# Patient Record
Sex: Male | Born: 1960 | Race: White | Hispanic: No | Marital: Married
Health system: Southern US, Community
[De-identification: ages and names within clinical notes are randomized; demographics above are authoritative.]

## PROBLEM LIST (undated history)

## (undated) DIAGNOSIS — M1990 Unspecified osteoarthritis, unspecified site: Secondary | ICD-10-CM

## (undated) DIAGNOSIS — E785 Hyperlipidemia, unspecified: Secondary | ICD-10-CM

## (undated) HISTORY — PX: HERNIA REPAIR: SHX51

## (undated) HISTORY — PX: BACK SURGERY: SHX140

## (undated) HISTORY — PX: SHOULDER SURGERY: SHX246

## (undated) HISTORY — PX: REPLACEMENT TOTAL KNEE: SUR1224

---

## 2018-01-05 ENCOUNTER — Encounter (HOSPITAL_COMMUNITY)
Admission: EM | Disposition: A | Discharge status: Home / Self Care | Payer: Self-pay | Source: Home / Self Care | Attending: Emergency Medicine

## 2018-01-05 ENCOUNTER — Emergency Department (HOSPITAL_COMMUNITY): Payer: BLUE CROSS/BLUE SHIELD

## 2018-01-05 ENCOUNTER — Encounter (HOSPITAL_COMMUNITY): Payer: Self-pay | Admitting: Emergency Medicine

## 2018-01-05 ENCOUNTER — Emergency Department (HOSPITAL_COMMUNITY): Payer: BLUE CROSS/BLUE SHIELD | Admitting: Registered Nurse

## 2018-01-05 ENCOUNTER — Observation Stay (HOSPITAL_COMMUNITY)
Admission: EM | Admit: 2018-01-05 | Discharge: 2018-01-06 | Disposition: A | Discharge status: Home / Self Care | Payer: BLUE CROSS/BLUE SHIELD | Attending: General Surgery | Admitting: General Surgery

## 2018-01-05 DIAGNOSIS — E785 Hyperlipidemia, unspecified: Secondary | ICD-10-CM | POA: Insufficient documentation

## 2018-01-05 DIAGNOSIS — M109 Gout, unspecified: Secondary | ICD-10-CM | POA: Insufficient documentation

## 2018-01-05 DIAGNOSIS — I700 Atherosclerosis of aorta: Secondary | ICD-10-CM | POA: Insufficient documentation

## 2018-01-05 DIAGNOSIS — K3580 Unspecified acute appendicitis: Principal | ICD-10-CM | POA: Insufficient documentation

## 2018-01-05 DIAGNOSIS — K3589 Other acute appendicitis without perforation or gangrene: Secondary | ICD-10-CM | POA: Diagnosis present

## 2018-01-05 DIAGNOSIS — Z7982 Long term (current) use of aspirin: Secondary | ICD-10-CM | POA: Diagnosis not present

## 2018-01-05 DIAGNOSIS — Z888 Allergy status to other drugs, medicaments and biological substances status: Secondary | ICD-10-CM | POA: Diagnosis not present

## 2018-01-05 DIAGNOSIS — Z791 Long term (current) use of non-steroidal anti-inflammatories (NSAID): Secondary | ICD-10-CM | POA: Diagnosis not present

## 2018-01-05 DIAGNOSIS — K55069 Acute infarction of intestine, part and extent unspecified: Secondary | ICD-10-CM

## 2018-01-05 DIAGNOSIS — Z79899 Other long term (current) drug therapy: Secondary | ICD-10-CM | POA: Insufficient documentation

## 2018-01-05 HISTORY — DX: Hyperlipidemia, unspecified: E78.5

## 2018-01-05 HISTORY — PX: LAPAROSCOPIC APPENDECTOMY: SHX408

## 2018-01-05 HISTORY — DX: Unspecified osteoarthritis, unspecified site: M19.90

## 2018-01-05 LAB — URINALYSIS, ROUTINE W REFLEX MICROSCOPIC
BILIRUBIN URINE: NEGATIVE
Glucose, UA: NEGATIVE mg/dL
HGB URINE DIPSTICK: NEGATIVE
KETONES UR: NEGATIVE mg/dL
Leukocytes, UA: NEGATIVE
NITRITE: NEGATIVE
Protein, ur: NEGATIVE mg/dL
Specific Gravity, Urine: 1.006 (ref 1.005–1.030)
pH: 6 (ref 5.0–8.0)

## 2018-01-05 LAB — COMPREHENSIVE METABOLIC PANEL
ALK PHOS: 82 U/L (ref 38–126)
ALT: 30 U/L (ref 0–44)
ANION GAP: 8 (ref 5–15)
AST: 20 U/L (ref 15–41)
Albumin: 4 g/dL (ref 3.5–5.0)
BILIRUBIN TOTAL: 0.9 mg/dL (ref 0.3–1.2)
BUN: 12 mg/dL (ref 6–20)
CALCIUM: 9.4 mg/dL (ref 8.9–10.3)
CO2: 24 mmol/L (ref 22–32)
Chloride: 104 mmol/L (ref 98–111)
Creatinine, Ser: 1.07 mg/dL (ref 0.61–1.24)
GFR calc Af Amer: >60 mL/min (ref >60)
GFR calc non Af Amer: >60 mL/min (ref >60)
Glucose, Bld: 127 mg/dL — ABNORMAL HIGH (ref 70–99)
Potassium: 4.3 mmol/L (ref 3.5–5.1)
SODIUM: 136 mmol/L (ref 135–145)
TOTAL PROTEIN: 7.7 g/dL (ref 6.5–8.1)

## 2018-01-05 LAB — CBC WITH DIFFERENTIAL/PLATELET
Abs Immature Granulocytes: 0.02 10*3/uL (ref 0.00–0.07)
BASOS ABS: 0 10*3/uL (ref 0.0–0.1)
BASOS PCT: 0 %
EOS ABS: 0.1 10*3/uL (ref 0.0–0.5)
EOS PCT: 2 %
HCT: 50.3 % (ref 39.0–52.0)
Hemoglobin: 16.4 g/dL (ref 13.0–17.0)
Immature Granulocytes: 0 %
Lymphocytes Relative: 26 %
Lymphs Abs: 2 10*3/uL (ref 0.7–4.0)
MCH: 28.7 pg (ref 26.0–34.0)
MCHC: 32.6 g/dL (ref 30.0–36.0)
MCV: 87.9 fL (ref 80.0–100.0)
MONO ABS: 0.7 10*3/uL (ref 0.1–1.0)
Monocytes Relative: 9 %
NRBC: 0 % (ref 0.0–0.2)
Neutro Abs: 4.7 10*3/uL (ref 1.7–7.7)
Neutrophils Relative %: 63 %
Platelets: 241 10*3/uL (ref 150–400)
RBC: 5.72 MIL/uL (ref 4.22–5.81)
RDW: 12.6 % (ref 11.5–15.5)
WBC: 7.6 10*3/uL (ref 4.0–10.5)

## 2018-01-05 LAB — LIPASE, BLOOD: LIPASE: 35 U/L (ref 11–51)

## 2018-01-05 SURGERY — APPENDECTOMY, LAPAROSCOPIC
Anesthesia: General | Site: Abdomen

## 2018-01-05 MED ORDER — METHOCARBAMOL 500 MG PO TABS
500.0000 mg | ORAL_TABLET | Freq: Four times a day (QID) | ORAL | Status: DC | PRN
  Administered 2018-01-05: 500 mg via ORAL

## 2018-01-05 MED ORDER — PIPERACILLIN-TAZOBACTAM 3.375 G IVPB 30 MIN
3.3750 g | Freq: Once | INTRAVENOUS | Status: AC
  Administered 2018-01-05: 3.375 g via INTRAVENOUS
  Filled 2018-01-05 (×2): qty 50

## 2018-01-05 MED ORDER — PIPERACILLIN-TAZOBACTAM 3.375 G IVPB 30 MIN: 3.3750 g | Freq: Once | INTRAVENOUS | Status: DC

## 2018-01-05 MED ORDER — METHOCARBAMOL 500 MG PO TABS
ORAL_TABLET | ORAL | Status: AC
  Filled 2018-01-05: qty 1

## 2018-01-05 MED ORDER — HYDROMORPHONE HCL 1 MG/ML IJ SOLN
1.0000 mg | Freq: Once | INTRAMUSCULAR | Status: AC
  Administered 2018-01-05: 1 mg via INTRAVENOUS
  Filled 2018-01-05: qty 1

## 2018-01-05 MED ORDER — LACTATED RINGERS IV SOLN
INTRAVENOUS | Status: DC
  Administered 2018-01-05 (×2): via INTRAVENOUS

## 2018-01-05 MED ORDER — ONDANSETRON 4 MG PO TBDP: 4.0000 mg | ORAL_TABLET | Freq: Four times a day (QID) | ORAL | Status: DC | PRN

## 2018-01-05 MED ORDER — BUPIVACAINE-EPINEPHRINE 0.25% -1:200000 IJ SOLN
INTRAMUSCULAR | Status: DC | PRN
  Administered 2018-01-05: 30 mL

## 2018-01-05 MED ORDER — ONDANSETRON HCL 4 MG/2ML IJ SOLN: 4.0000 mg | Freq: Four times a day (QID) | INTRAMUSCULAR | Status: DC | PRN

## 2018-01-05 MED ORDER — SUCCINYLCHOLINE CHLORIDE 20 MG/ML IJ SOLN
INTRAMUSCULAR | Status: DC | PRN
  Administered 2018-01-05: 120 mg via INTRAVENOUS

## 2018-01-05 MED ORDER — MEPERIDINE HCL 50 MG/ML IJ SOLN: 6.2500 mg | INTRAMUSCULAR | Status: DC | PRN

## 2018-01-05 MED ORDER — SODIUM CHLORIDE 0.9 % IR SOLN
Status: DC | PRN
  Administered 2018-01-05: 1000 mL

## 2018-01-05 MED ORDER — MIDAZOLAM HCL 2 MG/2ML IJ SOLN
INTRAMUSCULAR | Status: AC
  Filled 2018-01-05: qty 2

## 2018-01-05 MED ORDER — BUPIVACAINE-EPINEPHRINE (PF) 0.25% -1:200000 IJ SOLN
INTRAMUSCULAR | Status: AC
  Filled 2018-01-05: qty 30

## 2018-01-05 MED ORDER — CELECOXIB 200 MG PO CAPS
200.0000 mg | ORAL_CAPSULE | Freq: Two times a day (BID) | ORAL | Status: DC
  Administered 2018-01-05: 200 mg via ORAL
  Filled 2018-01-05: qty 1

## 2018-01-05 MED ORDER — ONDANSETRON HCL 4 MG/2ML IJ SOLN
INTRAMUSCULAR | Status: AC
  Filled 2018-01-05: qty 2

## 2018-01-05 MED ORDER — ONDANSETRON HCL 4 MG/2ML IJ SOLN
INTRAMUSCULAR | Status: AC
  Administered 2018-01-05: 4 mg via INTRAVENOUS
  Filled 2018-01-05: qty 2

## 2018-01-05 MED ORDER — LIDOCAINE 2% (20 MG/ML) 5 ML SYRINGE
INTRAMUSCULAR | Status: AC
  Filled 2018-01-05: qty 5

## 2018-01-05 MED ORDER — SODIUM CHLORIDE 0.9 % IV BOLUS
1000.0000 mL | Freq: Once | INTRAVENOUS | Status: AC
  Administered 2018-01-05: 1000 mL via INTRAVENOUS

## 2018-01-05 MED ORDER — GABAPENTIN 300 MG PO CAPS
300.0000 mg | ORAL_CAPSULE | Freq: Two times a day (BID) | ORAL | Status: DC
  Administered 2018-01-05: 300 mg via ORAL
  Filled 2018-01-05: qty 1

## 2018-01-05 MED ORDER — SODIUM CHLORIDE 0.9 % IV SOLN
INTRAVENOUS | Status: DC
  Administered 2018-01-05: 10:00:00 via INTRAVENOUS

## 2018-01-05 MED ORDER — HYDROMORPHONE HCL 1 MG/ML IJ SOLN
0.2500 mg | INTRAMUSCULAR | Status: DC | PRN
  Administered 2018-01-05 (×3): 0.5 mg via INTRAVENOUS

## 2018-01-05 MED ORDER — FENTANYL CITRATE (PF) 100 MCG/2ML IJ SOLN
50.0000 ug | Freq: Once | INTRAMUSCULAR | Status: AC
  Administered 2018-01-05: 50 ug via INTRAVENOUS

## 2018-01-05 MED ORDER — OXYCODONE HCL 5 MG PO TABS
5.0000 mg | ORAL_TABLET | ORAL | Status: DC | PRN
  Administered 2018-01-05: 5 mg via ORAL

## 2018-01-05 MED ORDER — HYDRALAZINE HCL 20 MG/ML IJ SOLN: 10.0000 mg | INTRAMUSCULAR | Status: DC | PRN

## 2018-01-05 MED ORDER — IOHEXOL 300 MG/ML  SOLN
100.0000 mL | Freq: Once | INTRAMUSCULAR | Status: AC | PRN
  Administered 2018-01-05: 100 mL via INTRAVENOUS

## 2018-01-05 MED ORDER — FENTANYL CITRATE (PF) 250 MCG/5ML IJ SOLN
INTRAMUSCULAR | Status: AC
  Filled 2018-01-05: qty 5

## 2018-01-05 MED ORDER — FENTANYL CITRATE (PF) 100 MCG/2ML IJ SOLN
INTRAMUSCULAR | Status: DC | PRN
  Administered 2018-01-05 (×2): 50 ug via INTRAVENOUS

## 2018-01-05 MED ORDER — DIPHENHYDRAMINE HCL 25 MG PO CAPS: 25.0000 mg | ORAL_CAPSULE | Freq: Four times a day (QID) | ORAL | Status: DC | PRN

## 2018-01-05 MED ORDER — HYDROMORPHONE HCL 1 MG/ML IJ SOLN
INTRAMUSCULAR | Status: AC
  Filled 2018-01-05: qty 1

## 2018-01-05 MED ORDER — HYDROMORPHONE HCL 1 MG/ML IJ SOLN
INTRAMUSCULAR | Status: AC
  Administered 2018-01-05: 0.5 mg via INTRAVENOUS
  Filled 2018-01-05: qty 1

## 2018-01-05 MED ORDER — DEXTROSE-NACL 5-0.45 % IV SOLN
INTRAVENOUS | Status: DC
  Administered 2018-01-05: 23:00:00 via INTRAVENOUS

## 2018-01-05 MED ORDER — ONDANSETRON HCL 4 MG/2ML IJ SOLN
4.0000 mg | Freq: Once | INTRAMUSCULAR | Status: AC
  Administered 2018-01-05: 4 mg via INTRAVENOUS
  Filled 2018-01-05: qty 2

## 2018-01-05 MED ORDER — HYDROMORPHONE HCL 1 MG/ML IJ SOLN
0.5000 mg | Freq: Once | INTRAMUSCULAR | Status: AC
  Administered 2018-01-05: 0.5 mg via INTRAVENOUS
  Filled 2018-01-05: qty 1

## 2018-01-05 MED ORDER — HYDROMORPHONE HCL 1 MG/ML IJ SOLN: 0.5000 mg | INTRAMUSCULAR | Status: DC | PRN

## 2018-01-05 MED ORDER — ONDANSETRON HCL 4 MG/2ML IJ SOLN
INTRAMUSCULAR | Status: DC | PRN
  Administered 2018-01-05: 4 mg via INTRAVENOUS

## 2018-01-05 MED ORDER — MIDAZOLAM HCL 5 MG/5ML IJ SOLN
INTRAMUSCULAR | Status: DC | PRN
  Administered 2018-01-05: 2 mg via INTRAVENOUS

## 2018-01-05 MED ORDER — HYDROCODONE-ACETAMINOPHEN 5-325 MG PO TABS: 1.0000 | ORAL_TABLET | ORAL | Status: DC | PRN

## 2018-01-05 MED ORDER — ROCURONIUM BROMIDE 50 MG/5ML IV SOSY
PREFILLED_SYRINGE | INTRAVENOUS | Status: AC
  Filled 2018-01-05: qty 5

## 2018-01-05 MED ORDER — 0.9 % SODIUM CHLORIDE (POUR BTL) OPTIME
TOPICAL | Status: DC | PRN
  Administered 2018-01-05: 1000 mL

## 2018-01-05 MED ORDER — ROCURONIUM BROMIDE 50 MG/5ML IV SOSY
PREFILLED_SYRINGE | INTRAVENOUS | Status: DC | PRN
  Administered 2018-01-05: 30 mg via INTRAVENOUS
  Administered 2018-01-05: 10 mg via INTRAVENOUS

## 2018-01-05 MED ORDER — TRAMADOL HCL 50 MG PO TABS: 50.0000 mg | ORAL_TABLET | Freq: Four times a day (QID) | ORAL | Status: DC | PRN

## 2018-01-05 MED ORDER — SUGAMMADEX SODIUM 200 MG/2ML IV SOLN
INTRAVENOUS | Status: DC | PRN
  Administered 2018-01-05: 150 mg via INTRAVENOUS
  Administered 2018-01-05: 50 mg via INTRAVENOUS

## 2018-01-05 MED ORDER — DEXAMETHASONE SODIUM PHOSPHATE 10 MG/ML IJ SOLN
INTRAMUSCULAR | Status: DC | PRN
  Administered 2018-01-05: 10 mg via INTRAVENOUS

## 2018-01-05 MED ORDER — GLYCOPYRROLATE PF 0.2 MG/ML IJ SOSY
PREFILLED_SYRINGE | INTRAMUSCULAR | Status: AC
  Filled 2018-01-05: qty 2

## 2018-01-05 MED ORDER — SODIUM CHLORIDE 0.45 % IV SOLN: INTRAVENOUS | Status: DC

## 2018-01-05 MED ORDER — PROPOFOL 10 MG/ML IV BOLUS
INTRAVENOUS | Status: DC | PRN
  Administered 2018-01-05: 50 mg via INTRAVENOUS
  Administered 2018-01-05: 150 mg via INTRAVENOUS

## 2018-01-05 MED ORDER — ONDANSETRON HCL 4 MG/2ML IJ SOLN
4.0000 mg | Freq: Four times a day (QID) | INTRAMUSCULAR | Status: DC | PRN
  Administered 2018-01-05: 4 mg via INTRAVENOUS

## 2018-01-05 MED ORDER — OXYCODONE HCL 5 MG PO TABS
ORAL_TABLET | ORAL | Status: AC
  Filled 2018-01-05: qty 1

## 2018-01-05 MED ORDER — FENTANYL CITRATE (PF) 100 MCG/2ML IJ SOLN
INTRAMUSCULAR | Status: AC
  Administered 2018-01-05: 50 ug via INTRAVENOUS
  Filled 2018-01-05: qty 2

## 2018-01-05 MED ORDER — DIPHENHYDRAMINE HCL 50 MG/ML IJ SOLN: 25.0000 mg | Freq: Four times a day (QID) | INTRAMUSCULAR | Status: DC | PRN

## 2018-01-05 MED ORDER — MORPHINE SULFATE (PF) 2 MG/ML IV SOLN: 2.0000 mg | INTRAVENOUS | Status: DC | PRN

## 2018-01-05 MED ORDER — EPHEDRINE 5 MG/ML INJ
INTRAVENOUS | Status: AC
  Filled 2018-01-05: qty 20

## 2018-01-05 MED ORDER — PROPOFOL 10 MG/ML IV BOLUS
INTRAVENOUS | Status: AC
  Filled 2018-01-05: qty 20

## 2018-01-05 MED ORDER — PHENYLEPHRINE 40 MCG/ML (10ML) SYRINGE FOR IV PUSH (FOR BLOOD PRESSURE SUPPORT)
PREFILLED_SYRINGE | INTRAVENOUS | Status: AC
  Filled 2018-01-05: qty 10

## 2018-01-05 MED ORDER — LIDOCAINE 2% (20 MG/ML) 5 ML SYRINGE
INTRAMUSCULAR | Status: DC | PRN
  Administered 2018-01-05: 100 mg via INTRAVENOUS

## 2018-01-05 MED ORDER — PROMETHAZINE HCL 25 MG/ML IJ SOLN: 6.2500 mg | INTRAMUSCULAR | Status: DC | PRN

## 2018-01-05 SURGICAL SUPPLY — 38 items
APPLIER CLIP 5 13 M/L LIGAMAX5 (MISCELLANEOUS)
BLADE CLIPPER SURG (BLADE) ×3 IMPLANT
CANISTER SUCT 3000ML PPV (MISCELLANEOUS) ×3 IMPLANT
CHLORAPREP W/TINT 26ML (MISCELLANEOUS) ×3 IMPLANT
CLIP APPLIE 5 13 M/L LIGAMAX5 (MISCELLANEOUS) IMPLANT
COVER SURGICAL LIGHT HANDLE (MISCELLANEOUS) ×3 IMPLANT
COVER WAND RF STERILE (DRAPES) ×3 IMPLANT
CUTTER ENDO LINEAR 45M (STAPLE) ×3 IMPLANT
DERMABOND ADVANCED (GAUZE/BANDAGES/DRESSINGS) ×2
DERMABOND ADVANCED .7 DNX12 (GAUZE/BANDAGES/DRESSINGS) ×1 IMPLANT
DEVICE PMI PUNCTURE CLOSURE (MISCELLANEOUS) ×3 IMPLANT
ELECT REM PT RETURN 9FT ADLT (ELECTROSURGICAL) ×3
ELECTRODE REM PT RTRN 9FT ADLT (ELECTROSURGICAL) ×1 IMPLANT
GLOVE BIO SURGEON STRL SZ 6 (GLOVE) ×3 IMPLANT
GLOVE INDICATOR 6.5 STRL GRN (GLOVE) ×3 IMPLANT
GOWN STRL REUS W/ TWL LRG LVL3 (GOWN DISPOSABLE) ×3 IMPLANT
GOWN STRL REUS W/TWL LRG LVL3 (GOWN DISPOSABLE) ×6
KIT BASIN OR (CUSTOM PROCEDURE TRAY) ×3 IMPLANT
KIT TURNOVER KIT B (KITS) ×3 IMPLANT
NEEDLE INSUFFLATION 14GA 120MM (NEEDLE) ×3 IMPLANT
NS IRRIG 1000ML POUR BTL (IV SOLUTION) ×3 IMPLANT
PAD ARMBOARD 7.5X6 YLW CONV (MISCELLANEOUS) ×6 IMPLANT
POUCH SPECIMEN RETRIEVAL 10MM (ENDOMECHANICALS) ×3 IMPLANT
RELOAD 45 VASCULAR/THIN (ENDOMECHANICALS) IMPLANT
RELOAD STAPLE TA45 3.5 REG BLU (ENDOMECHANICALS) IMPLANT
SCISSORS ENDO CVD 5DCS (MISCELLANEOUS) IMPLANT
SET IRRIG TUBING LAPAROSCOPIC (IRRIGATION / IRRIGATOR) ×3 IMPLANT
SHEARS HARMONIC ACE PLUS 36CM (ENDOMECHANICALS) IMPLANT
SLEEVE ENDOPATH XCEL 5M (ENDOMECHANICALS) ×3 IMPLANT
SPECIMEN JAR SMALL (MISCELLANEOUS) ×3 IMPLANT
SUT MNCRL AB 4-0 PS2 18 (SUTURE) ×3 IMPLANT
TOWEL OR 17X24 6PK STRL BLUE (TOWEL DISPOSABLE) ×3 IMPLANT
TRAY FOLEY CATH SILVER 16FR (SET/KITS/TRAYS/PACK) ×3 IMPLANT
TRAY LAPAROSCOPIC MC (CUSTOM PROCEDURE TRAY) ×3 IMPLANT
TROCAR XCEL 12X100 BLDLESS (ENDOMECHANICALS) ×3 IMPLANT
TROCAR XCEL NON-BLD 5MMX100MML (ENDOMECHANICALS) ×3 IMPLANT
TUBING INSUFFLATION (TUBING) ×3 IMPLANT
WATER STERILE IRR 1000ML POUR (IV SOLUTION) ×3 IMPLANT

## 2018-01-05 NOTE — ED Triage Notes (Signed)
Pt reports last night acute onset RLQ abdominal pain, pin point. No N/V/D. Hx of hernia repair to LLQ. Still has appendix. Pt in obvious discomfort. No urinary or penile symptoms.

## 2018-01-05 NOTE — Transfer of Care (Signed)
Immediate Anesthesia Transfer of Care Note  Patient: Colin Kim  Procedure(s) Performed: APPENDECTOMY LAPAROSCOPIC (N/A Abdomen)  Patient Location: PACU  Anesthesia Type:General  Level of Consciousness: awake, alert  and oriented  Airway & Oxygen Therapy: Patient Spontanous Breathing and Patient connected to nasal cannula oxygen  Post-op Assessment: Report given to RN and Post -op Vital signs reviewed and stable  Post vital signs: Reviewed and stable  Last Vitals:  Vitals Value Taken Time  BP    Temp    Pulse    Resp 13 01/05/2018  6:43 PM  SpO2    Vitals shown include unvalidated device data.  Last Pain:  Vitals:   01/05/18 1557  PainSc: 4       Patients Stated Pain Goal: 4 (01/05/18 1546)  Complications: No apparent anesthesia complications

## 2018-01-05 NOTE — Progress Notes (Signed)
Gave pt 50mcg of Fentanyl for pain. 50mcg of Fentanyl wasted in sharps, verified and witnessed by Darrick MeigsJamie G, DNP, CRNA.

## 2018-01-05 NOTE — Anesthesia Postprocedure Evaluation (Signed)
Anesthesia Post Note  Patient: Colin Kim  Procedure(s) Performed: APPENDECTOMY LAPAROSCOPIC (N/A Abdomen)     Patient location during evaluation: PACU Anesthesia Type: General Level of consciousness: sedated and patient cooperative Pain management: pain level controlled Vital Signs Assessment: post-procedure vital signs reviewed and stable Respiratory status: spontaneous breathing Cardiovascular status: stable Anesthetic complications: no    Last Vitals:  Vitals:   01/05/18 2124 01/05/18 2126  BP: (!) 149/97 (!) 144/96  Pulse: 72 71  Resp: 16   Temp: 36.7 C   SpO2: 98%     Last Pain:  Vitals:   01/05/18 2124  TempSrc: Oral  PainSc:                  Lewie LoronJohn Kingdavid Leinbach

## 2018-01-05 NOTE — Op Note (Signed)
Preoperative diagnosis: acute appendicitis  Postoperative diagnosis: acute appendicitis, abdominal hemorrhagic cyst  Procedure: laparoscopic appendectomy, laparoscopic excision of cyst  Surgeon: Feliciana RossettiLuke Galya Dunnigan, M.D.  Asst: none  Anesthesia: Gen.   Indications for procedure: Webb LawsDudley Kim is a 57 y.o. male with symptoms of pain in right lower quadrant and nausea consistent with acute appendicitis. Confirmed by CT scan.  Description of procedure: The patient was brought into the operative suite, placed supine. Anesthesia was administered with endotracheal tube. The patient's left arm was tucked. All pressure points were offloaded by foam padding. The patient was prepped and draped in the usual sterile fashion.  A transverse incision was made to the left of the umbilicus and a 5mm trocar was us. Pneumoperitoneum was applied with high flow low pressure.  2 5mm trocars were placed, one in the suprapubic space, one in the LLQ, the periumbilical incision was then up-sized and a 11mm trocar placed in that space. A transversus abdominal block was placed on the left and right sides. Next, the patient was placed in trendelenberg, rotated to the left. The omentum was retracted cephalad. The cecum and appendix were identified. There was a 1-2cm reddish brown mass along the right lower paracolic gutter with some inflamed pericolic fat in the area. Decision was made to excise the mass due to symptoms and because the appendix did not look very inflamed. The mass was free from surroundings and was removed easily. The inflamed portion was cut with scissors and removed. One clip was put at the base of the pericolic fat excision portion. The base of the appendix was dissected and a window through the mesoappendix was created with blunt dissection. Large Hem-o-lock clips were used to doubly ligate the base of the appendix and mesoappendix. The appendix was cut free with scissors.  The appendix was placed in a specimen  bag. The pelvis and RLQ were irrigated. No purulent fluid was identified in the pelvis or abdomen. The appendix was removed via the umbilicus. 0 vicryl was used to close the fascial defect. Pneumoperitoneum was removed, all trocars were removed. All incisions were closed with 4-0 monocryl subcuticular stitch. The patient woke from anesthesia and was brought to PACU in stable condition.  Findings: mildly inflamed appendix, 1-2cm brown mass in the right paracolic gutter  Specimen: appendix  Blood loss: 30 ml  Local anesthesia: 30 ml marcaine  Complications: none   Feliciana RossettiLuke Maeson Purohit, M.D. General, Bariatric, & Minimally Invasive Surgery [facility]

## 2018-01-05 NOTE — Anesthesia Preprocedure Evaluation (Signed)
Anesthesia Evaluation  Patient identified by MRN, date of birth, ID band Patient awake    Reviewed: Allergy & Precautions, NPO status , Patient's Chart, lab work & pertinent test results  Airway Mallampati: I  TM Distance: >3 FB Neck ROM: Full    Dental   Pulmonary    Pulmonary exam normal        Cardiovascular Normal cardiovascular exam     Neuro/Psych    GI/Hepatic   Endo/Other    Renal/GU      Musculoskeletal   Abdominal   Peds  Hematology   Anesthesia Other Findings   Reproductive/Obstetrics                             Anesthesia Physical Anesthesia Plan  ASA: II  Anesthesia Plan: General   Post-op Pain Management:    Induction: Intravenous, Rapid sequence and Cricoid pressure planned  PONV Risk Score and Plan: 2  Airway Management Planned: Oral ETT  Additional Equipment:   Intra-op Plan:   Post-operative Plan: Extubation in OR  Informed Consent: I have reviewed the patients History and Physical, chart, labs and discussed the procedure including the risks, benefits and alternatives for the proposed anesthesia with the patient or authorized representative who has indicated his/her understanding and acceptance.     Plan Discussed with: CRNA and Surgeon  Anesthesia Plan Comments:         Anesthesia Quick Evaluation

## 2018-01-05 NOTE — H&P (Signed)
[facility] Admission Note  Colin Kim Jul 27, 1960  [id number].    Requesting MD: Charlesetta Shanks Chief Complaint/Reason for Consult: acute appendicitis  HPI:  Colin Kim is a 57yo male who presented to [facility] earlier today with worsening abdominal pain. States that yesterday afternoon he started having mild RLQ abdominal pain, and generally felt like he was coming down with something. States that this morning his pain became more severe while at work. It is still in the RLQ, sharp and constant. Appetite suppressed. Denies nausea, vomiting, fever, chills, dysuria.  ED workup included CT scan which shows inflammatory-like thickening at the base of the cecum contiguous with the tip of the appendix, highly suspicious for acute tip appendicitis. WBC 7.6, afebrile. He was started on IV zosyn in the ED, and general surgery was called to see.  He has only had coffee this morning, otherwise NPO today.  -PMH significant for gout -Abdominal surgical history: inguinal hernia repair age 79 -Colonoscopy ~2012 in [facility], 1 benign polyp per patient otherwise normal -Blood thinning medications: 76m ASA daily -Nonsmoker -Employment: works for [employer] ROS: Review of Systems  Constitutional: Negative.  Negative for chills and fever.  HENT: Negative.   Eyes: Negative.   Respiratory: Negative.   Cardiovascular: Negative.   Gastrointestinal: Positive for abdominal pain. Negative for constipation, diarrhea, nausea and vomiting.  Genitourinary: Negative.  Negative for dysuria.  Musculoskeletal: Negative.   Skin: Negative.   Neurological: Negative.    All systems reviewed and otherwise negative except for as above  No family history on file.  Past Medical History:  Diagnosis Date  . Arthritis   . Hyperlipemia     Past Surgical History:  Procedure Laterality Date  . BACK SURGERY    . HERNIA REPAIR    . REPLACEMENT TOTAL KNEE    . SHOULDER SURGERY      Social  History:  has no tobacco, alcohol, and drug history on file.  Allergies:  Allergies  Allergen Reactions  . Statins     Joint pain     (Not in a hospital admission)  Prior to Admission medications   Medication Sig Start Date End Date Taking? Authorizing Provider  aspirin EC 81 MG tablet Take 81 mg by mouth at bedtime.   Yes [provider]  ibuprofen (ADVIL,MOTRIN) 800 MG tablet Take 800 mg by mouth 3 (three) times daily. 11/23/17  Yes [provider]    Blood pressure (!) 132/92, pulse 61, temperature 98.4 F (36.9 C), resp. rate 17, SpO2 96 %. Physical Exam: General: pleasant, WD/WN white male who is laying in bed in NAD HEENT: head is normocephalic, atraumatic.  Sclera are noninjected.  Pupils equal and round.  Ears and nose without any masses or lesions.  Mouth is pink and moist. Dentition fair Heart: regular, rate, and rhythm.  No obvious murmurs, gallops, or rubs noted.  Palpable pedal pulses bilaterally Lungs: CTAB, no wheezes, rhonchi, or rales noted.  Respiratory effort nonlabored Abd: soft, ND, +BS, no masses, hernias, or organomegaly. TTP RLQ with voluntary guarding MS: all 4 extremities are symmetrical with no cyanosis, clubbing, or edema. Skin: warm and dry with no masses, lesions, or rashes Psych: A&Ox3 with an appropriate affect. Neuro: cranial nerves grossly intact, extremity CSM intact bilaterally, normal speech  Results for orders placed or performed during the hospital encounter of 01/05/18 (from the past 48 hour(s))  Comprehensive metabolic panel     Status: Abnormal   Collection Time: 01/05/18  9:06 AM  Result Value Ref Range   Sodium 136 135 - 145 mmol/L   Potassium 4.3 3.5 - 5.1 mmol/L   Chloride 104 98 - 111 mmol/L   CO2 24 22 - 32 mmol/L   Glucose, Bld 127 (H) 70 - 99 mg/dL   BUN 12 6 - 20 mg/dL   Creatinine, Ser 1.07 0.61 - 1.24 mg/dL   Calcium 9.4 8.9 - 10.3 mg/dL   Total Protein 7.7 6.5 - 8.1 g/dL   Albumin 4.0 3.5 - 5.0 g/dL    AST 20 15 - 41 U/L   ALT 30 0 - 44 U/L   Alkaline Phosphatase 82 38 - 126 U/L   Total Bilirubin 0.9 0.3 - 1.2 mg/dL   GFR calc non Af Amer >60 >60 mL/min   GFR calc Af Amer >60 >60 mL/min    Comment: (NOTE) The eGFR has been calculated using the CKD EPI equation. This calculation has not been validated in all clinical situations. eGFR's persistently <60 mL/min signify possible Chronic Kidney Disease.    Anion gap 8 5 - 15    Comment: Performed at [facility] [address] [place], [place] [address]  Lipase, blood     Status: None   Collection Time: 01/05/18  9:06 AM  Result Value Ref Range   Lipase 35 11 - 51 U/L    Comment: Performed at [facility] [address], [place], [place] [address]  CBC WITH DIFFERENTIAL     Status: None   Collection Time: 01/05/18  9:06 AM  Result Value Ref Range   WBC 7.6 4.0 - 10.5 K/uL   RBC 5.72 4.22 - 5.81 MIL/uL   Hemoglobin 16.4 13.0 - 17.0 g/dL   HCT 50.3 39.0 - 52.0 %   MCV 87.9 80.0 - 100.0 fL   MCH 28.7 26.0 - 34.0 pg   MCHC 32.6 30.0 - 36.0 g/dL   RDW 12.6 11.5 - 15.5 %   Platelets 241 150 - 400 K/uL   nRBC 0.0 0.0 - 0.2 %   Neutrophils Relative % 63 %   Neutro Abs 4.7 1.7 - 7.7 K/uL   Lymphocytes Relative 26 %   Lymphs Abs 2.0 0.7 - 4.0 K/uL   Monocytes Relative 9 %   Monocytes Absolute 0.7 0.1 - 1.0 K/uL   Eosinophils Relative 2 %   Eosinophils Absolute 0.1 0.0 - 0.5 K/uL   Basophils Relative 0 %   Basophils Absolute 0.0 0.0 - 0.1 K/uL   Immature Granulocytes 0 %   Abs Immature Granulocytes 0.02 0.00 - 0.07 K/uL    Comment: Performed at [facility], [address] [place], [place] [address]  Urinalysis, Routine w reflex microscopic     Status: None   Collection Time: 01/05/18 10:10 AM  Result Value Ref Range   Color, Urine YELLOW YELLOW   APPearance CLEAR CLEAR   Specific Gravity, Urine 1.006 1.005 - 1.030   pH 6.0 5.0 - 8.0   Glucose, UA NEGATIVE NEGATIVE mg/dL   Hgb urine dipstick NEGATIVE  NEGATIVE   Bilirubin Urine NEGATIVE NEGATIVE   Ketones, ur NEGATIVE NEGATIVE mg/dL   Protein, ur NEGATIVE NEGATIVE mg/dL   Nitrite NEGATIVE NEGATIVE   Leukocytes, UA NEGATIVE NEGATIVE    Comment: Performed at [facility] [address] [place], [place] [address]   Ct Abdomen Pelvis W Contrast  Result Date: 01/05/2018 CLINICAL DATA:  RIGHT lower quadrant abdominal pain, acute onset last night. Afebrile. EXAM:  CT ABDOMEN AND PELVIS WITH CONTRAST TECHNIQUE: Multidetector CT imaging of the abdomen and pelvis was performed using the standard protocol following bolus administration of intravenous contrast. CONTRAST:  177m OMNIPAQUE IOHEXOL 300 MG/ML  SOLN COMPARISON:  None. FINDINGS: Lower chest: No acute abnormality. Hepatobiliary: No focal liver abnormality is seen. No gallstones, gallbladder wall thickening, or biliary dilatation. Pancreas: Unremarkable. No pancreatic ductal dilatation or surrounding inflammatory changes. Spleen: Normal in size without focal abnormality. Adrenals/Urinary Tract: Adrenal glands appear normal. Kidneys are unremarkable without mass, stone or hydronephrosis. No perinephric fluid. No ureteral or bladder calculi. Bladder appears normal. Stomach/Bowel: No dilated large or small bowel loops. There is prominent inflammatory thickening at the base of the cecum, contiguous with the tip of the appendix (axial series 3, images 70 through 73; sagittal series 7, images 25 through 31; coronal series 6, images 50 through 52; Vascular/Lymphatic: Aortic atherosclerosis. No enlarged abdominal or pelvic lymph nodes. Reproductive: Prostate is unremarkable. Other: Mild fluid stranding within the RIGHT lower quadrant mesentery. No abscess collection seen. No free intraperitoneal air seen. Musculoskeletal: No acute or suspicious osseous finding. Mild degenerative spondylosis within the lower lumbar spine. IMPRESSION: Prominent inflammatory-like thickening at the base of the cecum,  contiguous with the tip of the appendix, highly suspicious for acute tip appendicitis. Additional differential considerations include cecal diverticulitis, typhlitis and neoplastic process of the cecum or appendix. No abscess collection seen. No free intraperitoneal air. Aortic Atherosclerosis (ICD10-I70.0). Electronically Signed   By: SFranki CabotM.D.   On: 01/05/2018 12:03      Assessment/Plan Gout  Acute appendicitis - acute onset, worsening RLQ pain - CT scan shows inflammatory-like thickening at the base of the cecum contiguous with the tip of the appendix, highly suspicious for acute tip appendicitis. WBC 7.6, afebrile - Recommend laparoscopic appendectomy. Keep NPO and continue IVF. He was started on IV zosyn in the ED. Will post for later today.  ID - zosyn 11/15 VTE - SCDs, lovenox FEN - IVF, NPO Foley - none Follow up - TBD  BWellington [facility] 01/05/2018, 12:59 PM Pager: [phone] 7:00 am -11:30 AM Tues-Fri 7:00 am-4:30 pm Sat-Sun 7:00 am-11:30 am

## 2018-01-05 NOTE — ED Provider Notes (Signed)
[facility] EMERGENCY DEPARTMENT Provider Note   CSN: [id number] Arrival date & time: 01/05/18  9629     History   Chief Complaint Chief Complaint  Patient presents with  . Abdominal Pain    RLQ    HPI Colin Kim is a 57 y.o. male.  HPI Patient reports that last night he started getting right lower quadrant pain.  He reports that it started pretty quickly and then just progressively got worse.  There is a local spot in his right lower abdomen that is very painful.  He denies any nausea vomiting or diarrhea.  No fever.  No pain burning with urination.  No blood in the urine.  He denies pain into the testicles.  Patient has had a history of a hernia repair on the left.  He is otherwise healthy with diet-controlled hypercholesterolemia. Past Medical History:  Diagnosis Date  . Arthritis   . Hyperlipemia     There are no active problems to display for this patient.         Home Medications    Prior to Admission medications   Medication Sig Start Date End Date Taking? Authorizing Provider  aspirin EC 81 MG tablet Take 81 mg by mouth at bedtime.   Yes [provider]  ibuprofen (ADVIL,MOTRIN) 800 MG tablet Take 800 mg by mouth 3 (three) times daily. 11/23/17  Yes [provider]    Family History No family history on file.  Social History Social History   Tobacco Use  . Smoking status: Not on file  Substance Use Topics  . Alcohol use: Not on file  . Drug use: Not on file     Allergies   Statins   Review of Systems Review of Systems 10 Systems reviewed and are negative for acute change except as noted in the HPI.  Physical Exam Updated Vital Signs BP (!) 132/92   Pulse 61   Temp 98.4 F (36.9 C)   Resp 17   SpO2 96%   Physical Exam  Constitutional: He is oriented to person, place, and time. He appears well-developed and well-nourished.  Severe pain with movement of abdomen  HENT:  Head: Normocephalic and  atraumatic.  Mouth/Throat: Oropharynx is clear and moist.  Eyes: EOM are normal.  Cardiovascular: Normal rate, regular rhythm, normal heart sounds and intact distal pulses.  Pulmonary/Chest: Effort normal and breath sounds normal.  Abdominal: Soft. He exhibits no distension. There is tenderness. There is guarding.  Severe pain to palpation deep RLQ. No rebound.  Genitourinary:  Genitourinary Comments: Penis and scrotum normal. Testicles and spermatic cord nontender. Can palpate inguinal ring. No mass but very tender in inguinal canal.  Musculoskeletal: Normal range of motion. He exhibits no edema or tenderness.  Neurological: He is alert and oriented to person, place, and time. No cranial nerve deficit. He exhibits normal muscle tone. Coordination normal.  Skin: Skin is warm and dry.  Psychiatric: He has a normal mood and affect.     ED Treatments / Results  Labs (all labs ordered are listed, but only abnormal results are displayed) Labs Reviewed  COMPREHENSIVE METABOLIC PANEL - Abnormal; Notable for the following components:      Result Value   Glucose, Bld 127 (*)    All other components within normal limits  LIPASE, BLOOD  CBC WITH DIFFERENTIAL/PLATELET  URINALYSIS, ROUTINE W REFLEX MICROSCOPIC    EKG None  Radiology Ct Abdomen Pelvis W Contrast  Result Date: 01/05/2018 CLINICAL DATA:  RIGHT  lower quadrant abdominal pain, acute onset last night. Afebrile. EXAM: CT ABDOMEN AND PELVIS WITH CONTRAST TECHNIQUE: Multidetector CT imaging of the abdomen and pelvis was performed using the standard protocol following bolus administration of intravenous contrast. CONTRAST:  100mL OMNIPAQUE IOHEXOL 300 MG/ML  SOLN COMPARISON:  None. FINDINGS: Lower chest: No acute abnormality. Hepatobiliary: No focal liver abnormality is seen. No gallstones, gallbladder wall thickening, or biliary dilatation. Pancreas: Unremarkable. No pancreatic ductal dilatation or surrounding inflammatory changes.  Spleen: Normal in size without focal abnormality. Adrenals/Urinary Tract: Adrenal glands appear normal. Kidneys are unremarkable without mass, stone or hydronephrosis. No perinephric fluid. No ureteral or bladder calculi. Bladder appears normal. Stomach/Bowel: No dilated large or small bowel loops. There is prominent inflammatory thickening at the base of the cecum, contiguous with the tip of the appendix (axial series 3, images 70 through 73; sagittal series 7, images 25 through 31; coronal series 6, images 50 through 52; Vascular/Lymphatic: Aortic atherosclerosis. No enlarged abdominal or pelvic lymph nodes. Reproductive: Prostate is unremarkable. Other: Mild fluid stranding within the RIGHT lower quadrant mesentery. No abscess collection seen. No free intraperitoneal air seen. Musculoskeletal: No acute or suspicious osseous finding. Mild degenerative spondylosis within the lower lumbar spine. IMPRESSION: Prominent inflammatory-like thickening at the base of the cecum, contiguous with the tip of the appendix, highly suspicious for acute tip appendicitis. Additional differential considerations include cecal diverticulitis, typhlitis and neoplastic process of the cecum or appendix. No abscess collection seen. No free intraperitoneal air. Aortic Atherosclerosis (ICD10-I70.0). Electronically Signed   By: Bary RichardStan  Maynard M.D.   On: 01/05/2018 12:03    Procedures Procedures (including critical care time)  Medications Ordered in ED Medications  sodium chloride 0.9 % bolus 1,000 mL (0 mLs Intravenous Stopped 01/05/18 0950)    And  0.9 %  sodium chloride infusion ( Intravenous Stopped 01/05/18 1215)  HYDROmorphone (DILAUDID) injection 1 mg (has no administration in time range)  piperacillin-tazobactam (ZOSYN) IVPB 3.375 g (has no administration in time range)  HYDROmorphone (DILAUDID) injection 0.5 mg (0.5 mg Intravenous Given 01/05/18 0916)  ondansetron (ZOFRAN) injection 4 mg (4 mg Intravenous Given 01/05/18  0916)  HYDROmorphone (DILAUDID) injection 1 mg (1 mg Intravenous Given 01/05/18 0949)  iohexol (OMNIPAQUE) 300 MG/ML solution 100 mL (100 mLs Intravenous Contrast Given 01/05/18 1142)     Initial Impression / Assessment and Plan / ED Course  I have reviewed the triage vital signs and the nursing notes.  Pertinent labs & imaging results that were available during my care of the patient were reviewed by me and considered in my medical decision making (see chart for details).  Clinical Course as of Jan 05 1222  Fri Jan 05, 2018  1214 Consult to general surgery ordered   [MP]  1222 Consult: Reviewed with Nehemiah SettleBrooke of general surgery.  Patient will be evaluated in the emergency department.   [MP]    Clinical Course User Index [MP] Arby BarrettePfeiffer, Gurjit Loconte, MD   Patient is nontoxic and alert.  He had fairly acute onset of right lower quadrant pain.  He otherwise has very limited medical history.  CT shows inflammation at the base of the cecum contiguous with tip of the appendix.  Appendicitis is consistent with the patient's presentation.  General surgery has been consulted.  Patient is pain controlled with Dilaudid.  Zosyn ordered.  Anticipate surgical consultation in the emergency department for definitive management.  Final Clinical Impressions(s) / ED Diagnoses   Final diagnoses:  Other acute appendicitis    ED Discharge Orders  None       Arby Barrette, MD 01/05/18 1226

## 2018-01-05 NOTE — Anesthesia Procedure Notes (Addendum)
Procedure Name: Intubation Date/Time: 01/05/2018 5:38 PM Performed by: Trinna Post., CRNA Pre-anesthesia Checklist: Patient identified, Emergency Drugs available, Suction available, Patient being monitored and Timeout performed Patient Re-evaluated:Patient Re-evaluated prior to induction Oxygen Delivery Method: Circle system utilized Preoxygenation: Pre-oxygenation with 100% oxygen Induction Type: IV induction, Rapid sequence and Cricoid Pressure applied Ventilation: Mask ventilation without difficulty Laryngoscope Size: Mac and 4 Grade View: Grade III Tube type: Oral Tube size: 7.5 mm Number of attempts: 3 (DVL x1 with MAC4 by CRNA with no view, DVLx2 by MDA with success on second attempt) Airway Equipment and Method: Stylet Placement Confirmation: ETT inserted through vocal cords under direct vision,  positive ETCO2 and breath sounds checked- equal and bilateral Secured at: 22 cm Tube secured with: Tape Dental Injury: Teeth and Oropharynx as per pre-operative assessment  Difficulty Due To: Difficulty was unanticipated and Difficult Airway- due to anterior larynx Comments: Would suggest glidescope on future intubations

## 2018-01-06 ENCOUNTER — Encounter (HOSPITAL_COMMUNITY): Payer: Self-pay | Admitting: General Surgery

## 2018-01-06 DIAGNOSIS — K55069 Acute infarction of intestine, part and extent unspecified: Secondary | ICD-10-CM

## 2018-01-06 LAB — HIV ANTIBODY (ROUTINE TESTING W REFLEX): HIV SCREEN 4TH GENERATION: NONREACTIVE

## 2018-01-06 MED ORDER — TRAMADOL HCL 50 MG PO TABS: 50.0000 mg | ORAL_TABLET | Freq: Four times a day (QID) | ORAL | 0 refills | Status: AC | PRN

## 2018-01-06 MED ORDER — ASPIRIN EC 81 MG PO TBEC: 81.0000 mg | DELAYED_RELEASE_TABLET | Freq: Every day | ORAL | Status: DC

## 2018-01-06 NOTE — Progress Notes (Signed)
Colin Kim to be D/C'd  per MD order. Discussed with the patient and all questions fully answered.  VSS, Skin clean, dry and intact without evidence of skin break down, no evidence of skin tears noted.  IV catheter discontinued intact. Site without signs and symptoms of complications. Dressing and pressure applied.  An After Visit Summary was printed and given to the patient. Patient received prescription.  D/c education completed with patient/family including follow up instructions, medication list, d/c activities limitations if indicated, with other d/c instructions as indicated by MD - patient able to verbalize understanding, all questions fully answered.   Patient instructed to return to ED, call 911, or call MD for any changes in condition.   Patient to be escorted via WC, and D/C home via private auto.

## 2018-01-06 NOTE — Discharge Instructions (Signed)
SURGERY: POST OP INSTRUCTIONS °(Surgery for small bowel obstruction, colon resection, etc) ° ° °###################################################################### ° °EAT °Gradually transition to a high fiber diet with a fiber supplement over the next few days after discharge ° °WALK °Walk an hour a day.  Control your pain to do that.   ° °CONTROL PAIN °Control pain so that you can walk, sleep, tolerate sneezing/coughing, go up/down stairs. ° °HAVE A BOWEL MOVEMENT DAILY °Keep your bowels regular to avoid problems.  OK to try a laxative to override constipation.  OK to use an antidairrheal to slow down diarrhea.  Call if not better after 2 tries ° °CALL IF YOU HAVE PROBLEMS/CONCERNS °Call if you are still struggling despite following these instructions. °Call if you have concerns not answered by these instructions ° °###################################################################### ° ° °DIET °Follow a light diet the first few days at home.  Start with a bland diet such as soups, liquids, starchy foods, low fat foods, etc.  If you feel full, bloated, or constipated, stay on a ful liquid or pureed/blenderized diet for a few days until you feel better and no longer constipated. °Be sure to drink plenty of fluids every day to avoid getting dehydrated (feeling dizzy, not urinating, etc.). °Gradually add a fiber supplement to your diet over the next week.  Gradually get back to a regular solid diet.  Avoid fast food or heavy meals the first week as you are more likely to get nauseated. °It is expected for your digestive tract to need a few months to get back to normal.  It is common for your bowel movements and stools to be irregular.  You will have occasional bloating and cramping that should eventually fade away.  Until you are eating solid food normally, off all pain medications, and back to regular activities; your bowels will not be normal. °Focus on eating a low-fat, high fiber diet the rest of your life  (See Getting to Good Bowel Health, below). ° °CARE of your INCISION or WOUND °It is good for closed incision and even open wounds to be washed every day.  Shower every day.  Short baths are fine.  Wash the incisions and wounds clean with soap & water.    °If you have a closed incision(s), wash the incision with soap & water every day.  You may leave closed incisions open to air if it is dry.   You may cover the incision with clean gauze & replace it after your daily shower for comfort. °If you have skin tapes (Steristrips) or skin glue (Dermabond) on your incision, leave them in place.  They will fall off on their own like a scab.  You may trim any edges that curl up with clean scissors.  If you have staples, set up an appointment for them to be removed in the office in 10 days after surgery.  °If you have a drain, wash around the skin exit site with soap & water and place a new dressing of gauze or band aid around the skin every day.  Keep the drain site clean & dry.    °If you have an open wound with packing, see wound care instructions.  In general, it is encouraged that you remove your dressing and packing, shower with soap & water, and replace your dressing once a day.  Pack the wound with clean gauze moistened with normal (0.9%) saline to keep the wound moist & uninfected.  Pressure on the dressing for 30 minutes will stop most wound   bleeding.  Eventually your body will heal & pull the open wound closed over the next few months.  °Raw open wounds will occasionally bleed or secrete yellow drainage until it heals closed.  Drain sites will drain a little until the drain is removed.  Even closed incisions can have mild bleeding or drainage the first few days until the skin edges scab over & seal.   °If you have an open wound with a wound vac, see wound vac care instructions. ° ° ° ° °ACTIVITIES as tolerated °Start light daily activities --- self-care, walking, climbing stairs-- beginning the day after surgery.   Gradually increase activities as tolerated.  Control your pain to be active.  Stop when you are tired.  Ideally, walk several times a day, eventually an hour a day.   °Most people are back to most day-to-day activities in a few weeks.  It takes 4-8 weeks to get back to unrestricted, intense activity. °If you can walk 30 minutes without difficulty, it is safe to try more intense activity such as jogging, treadmill, bicycling, low-impact aerobics, swimming, etc. °Save the most intensive and strenuous activity for last (Usually 4-8 weeks after surgery) such as sit-ups, heavy lifting, contact sports, etc.  Refrain from any intense heavy lifting or straining until you are off narcotics for pain control.  You will have off days, but things should improve week-by-week. °DO NOT PUSH THROUGH PAIN.  Let pain be your guide: If it hurts to do something, don't do it.  Pain is your body warning you to avoid that activity for another week until the pain goes down. °You may drive when you are no longer taking narcotic prescription pain medication, you can comfortably wear a seatbelt, and you can safely make sudden turns/stops to protect yourself without hesitating due to pain. °You may have sexual intercourse when it is comfortable. If it hurts to do something, stop. ° °MEDICATIONS °Take your usually prescribed home medications unless otherwise directed.   °Blood thinners:  °Usually you can restart any strong blood thinners after the second postoperative day.  It is OK to take aspirin right away.    ° If you are on strong blood thinners (warfarin/Coumadin, Plavix, Xerelto, Eliquis, Pradaxa, etc), discuss with your surgeon, medicine PCP, and/or cardiologist for instructions on when to restart the blood thinner & if blood monitoring is needed (PT/INR blood check, etc).   ° ° °PAIN CONTROL °Pain after surgery or related to activity is often due to strain/injury to muscle, tendon, nerves and/or incisions.  This pain is usually  short-term and will improve in a few months.  °To help speed the process of healing and to get back to regular activity more quickly, DO THE FOLLOWING THINGS TOGETHER: °1. Increase activity gradually.  DO NOT PUSH THROUGH PAIN °2. Use Ice and/or Heat °3. Try Gentle Massage and/or Stretching °4. Take over the counter pain medication °5. Take Narcotic prescription pain medication for more severe pain ° °Good pain control = faster recovery.  It is better to take more medicine to be more active than to stay in bed all day to avoid medications. °1.  Increase activity gradually °Avoid heavy lifting at first, then increase to lifting as tolerated over the next 6 weeks. °Do not “push through” the pain.  Listen to your body and avoid positions and maneuvers than reproduce the pain.  Wait a few days before trying something more intense °Walking an hour a day is encouraged to help your body recover faster   and more safely.  Start slowly and stop when getting sore.  If you can walk 30 minutes without stopping or pain, you can try more intense activity (running, jogging, aerobics, cycling, swimming, treadmill, sex, sports, weightlifting, etc.) °Remember: If it hurts to do it, then don’t do it! °2. Use Ice and/or Heat °You will have swelling and bruising around the incisions.  This will take several weeks to resolve. °Ice packs or heating pads (6-8 times a day, 30-60 minutes at a time) will help sooth soreness & bruising. °Some people prefer to use ice alone, heat alone, or alternate between ice & heat.  Experiment and see what works best for you.  Consider trying ice for the first few days to help decrease swelling and bruising; then, switch to heat to help relax sore spots and speed recovery. °Shower every day.  Short baths are fine.  It feels good!  Keep the incisions and wounds clean with soap & water.   °3. Try Gentle Massage and/or Stretching °Massage at the area of pain many times a day °Stop if you feel pain - do not  overdo it °4. Take over the counter pain medication °This helps the muscle and nerve tissues become less irritable and calm down faster °Choose ONE of the following over-the-counter anti-inflammatory medications: °Acetaminophen 500mg tabs (Tylenol) 1-2 pills with every meal and just before bedtime (avoid if you have liver problems or if you have acetaminophen in you narcotic prescription) °Naproxen 220mg tabs (ex. Aleve, Naprosyn) 1-2 pills twice a day (avoid if you have kidney, stomach, IBD, or bleeding problems) °Ibuprofen 200mg tabs (ex. Advil, Motrin) 3-4 pills with every meal and just before bedtime (avoid if you have kidney, stomach, IBD, or bleeding problems) °Take with food/snack several times a day as directed for at least 2 weeks to help keep pain / soreness down & more manageable. °5. Take Narcotic prescription pain medication for more severe pain °A prescription for strong pain control is often given to you upon discharge (for example: oxycodone/Percocet, hydrocodone/Norco/Vicodin, or tramadol/Ultram) °Take your pain medication as prescribed. °Be mindful that most narcotic prescriptions contain Tylenol (acetaminophen) as well - avoid taking too much Tylenol. °If you are having problems/concerns with the prescription medicine (does not control pain, nausea, vomiting, rash, itching, etc.), please call us [phone] to see if we need to switch you to a different pain medicine that will work better for you and/or control your side effects better. °If you need a refill on your pain medication, you must call the office before 4 pm and on weekdays only.  By federal law, prescriptions for narcotics cannot be called into a pharmacy.  They must be filled out on paper & picked up from our office by the patient or authorized caretaker.  Prescriptions cannot be filled after 4 pm nor on weekends.   ° °WHEN TO CALL US [phone] °Severe uncontrolled or worsening pain  °Fever over 101 F (38.5 C) °Concerns with  the incision: Worsening pain, redness, rash/hives, swelling, bleeding, or drainage °Reactions / problems with new medications (itching, rash, hives, nausea, etc.) °Nausea and/or vomiting °Difficulty urinating °Difficulty breathing °Worsening fatigue, dizziness, lightheadedness, blurred vision °Other concerns °If you are not getting better after two weeks or are noticing you are getting worse, contact our office [phone] for further advice.  We may need to adjust your medications, re-evaluate you in the office, send you to the emergency room, or see what other things we can do to help. °The   clinic staff is available to answer your questions during regular business hours (8:30am-5pm).  Please don’t hesitate to call and ask to speak to one of our nurses for clinical concerns.    °A surgeon from [facility] is always on call at the hospitals 24 hours/day °If you have a medical emergency, go to the nearest emergency room or call 911. ° °FOLLOW UP in our office °One the day of your discharge from the hospital (or the next business weekday), please call [facility] to set up or confirm an appointment to see your surgeon in the office for a follow-up appointment.  Usually it is 2-3 weeks after your surgery.   °If you have skin staples at your incision(s), let the office know so we can set up a time in the office for the nurse to remove them (usually around 10 days after surgery). °Make sure that you call for appointments the day of discharge (or the next business weekday) from the hospital to ensure a convenient appointment time. °IF YOU HAVE DISABILITY OR FAMILY LEAVE FORMS, BRING THEM TO THE OFFICE FOR PROCESSING.  DO NOT GIVE THEM TO YOUR DOCTOR. ° °[facility] °[address], [address], [place], [place]  [address] ? °[phone] - Main °[phone] - Toll Free,  [phone] - Fax °[email or web address] ° °GETTING TO GOOD BOWEL HEALTH. °It is  expected for your digestive tract to need a few months to get back to normal.  It is common for your bowel movements and stools to be irregular.  You will have occasional bloating and cramping that should eventually fade away.  Until you are eating solid food normally, off all pain medications, and back to regular activities; your bowels will not be normal.   °Avoiding constipation °The goal: ONE SOFT BOWEL MOVEMENT A DAY!    °Drink plenty of fluids.  Choose water first. °TAKE A FIBER SUPPLEMENT EVERY DAY THE REST OF YOUR LIFE °During your first week back home, gradually add back a fiber supplement every day °Experiment which form you can tolerate.   There are many forms such as powders, tablets, wafers, gummies, etc °Psyllium bran (Metamucil), methylcellulose (Citrucel), Miralax or Glycolax, Benefiber, Flax Seed.  °Adjust the dose week-by-week (1/2 dose/day to 6 doses a day) until you are moving your bowels 1-2 times a day.  Cut back the dose or try a different fiber product if it is giving you problems such as diarrhea or bloating. °Sometimes a laxative is needed to help jump-start bowels if constipated until the fiber supplement can help regulate your bowels.  If you are tolerating eating & you are farting, it is okay to try a gentle laxative such as double dose MiraLax, prune juice, or Milk of Magnesia.  Avoid using laxatives too often. °Stool softeners can sometimes help counteract the constipating effects of narcotic pain medicines.  It can also cause diarrhea, so avoid using for too long. °If you are still constipated despite taking fiber daily, eating solids, and a few doses of laxatives, call our office. °Controlling diarrhea °Try drinking liquids and eating bland foods for a few days to avoid stressing your intestines further. °Avoid dairy products (especially milk & ice cream) for a short time.  The intestines often can lose the ability to digest lactose when stressed. °Avoid foods that cause gassiness or  bloating.  Typical foods include beans and other legumes, cabbage, broccoli, and dairy foods.  Avoid greasy, spicy, fast foods.  Every person has   some sensitivity to other foods, so listen to your body and avoid those foods that trigger problems for you. °Probiotics (such as active yogurt, Align, etc) may help repopulate the intestines and colon with normal bacteria and calm down a sensitive digestive tract °Adding a fiber supplement gradually can help thicken stools by absorbing excess fluid and retrain the intestines to act more normally.  Slowly increase the dose over a few weeks.  Too much fiber too soon can backfire and cause cramping & bloating. °It is okay to try and slow down diarrhea with a few doses of antidiarrheal medicines.   °Bismuth subsalicylate (ex. Kayopectate, Pepto Bismol) for a few doses can help control diarrhea.  Avoid if pregnant.   °Loperamide (Imodium) can slow down diarrhea.  Start with one tablet (2mg) first.  Avoid if you are having fevers or severe pain.  °ILEOSTOMY PATIENTS WILL HAVE CHRONIC DIARRHEA since their colon is not in use.    °Drink plenty of liquids.  You will need to drink even more glasses of water/liquid a day to avoid getting dehydrated. °Record output from your ileostomy.  Expect to empty the bag every 3-4 hours at first.  Most people with a permanent ileostomy empty their bag 4-6 times at the least.   °Use antidiarrheal medicine (especially Imodium) several times a day to avoid getting dehydrated.  Start with a dose at bedtime & breakfast.  Adjust up or down as needed.  Increase antidiarrheal medications as directed to avoid emptying the bag more than 8 times a day (every 3 hours). °Work with your wound ostomy nurse to learn care for your ostomy.  See ostomy care instructions. °TROUBLESHOOTING IRREGULAR BOWELS °1) Start with a soft & bland diet. No spicy, greasy, or fried foods.  °2) Avoid gluten/wheat or dairy products from diet to see if symptoms improve. °3) Miralax  17gm or flax seed mixed in 8oz. water or juice-daily. May use 2-4 times a day as needed. °4) Gas-X, Phazyme, etc. as needed for gas & bloating.  °5) Prilosec (omeprazole) over-the-counter as needed °6)  Consider probiotics (Align, Activa, etc) to help calm the bowels down ° °Call your doctor if you are getting worse or not getting better.  Sometimes further testing (cultures, endoscopy, X-ray studies, CT scans, bloodwork, etc.) may be needed to help diagnose and treat the cause of the diarrhea. °[facility] °[address], [address], [place], [place]  [address] °[phone] - Main.    °[phone]  - Toll Free.   [phone] - Fax °[email or web address] ° ° ° °Appendicitis °The appendix is a finger-shaped tube that is attached to the large intestine. Appendicitis is inflammation of the appendix. Without treatment, appendicitis can cause the appendix to tear (rupture). A ruptured appendix can lead to a life-threatening infection. It can also lead to the formation of a painful collection of pus (abscess) in the appendix. °What are the causes? °This condition may be caused by a blockage in the appendix that leads to infection. The blockage can be due to: °· A ball of stool. °· Enlarged lymph glands. °In some cases, the cause may not be known. °What increases the risk? °This condition is more likely to develop in people who are 10-30 years of age. °What are the signs or symptoms? °Symptoms of this condition include: °· Pain around the belly button that moves toward the lower right abdomen. The pain can become more severe as time passes. It gets worse with coughing or sudden movements. °·   Tenderness in the lower right abdomen. °· Nausea. °· Vomiting. °· Loss of appetite. °· Fever. °· Constipation. °· Diarrhea. °· Generally not feeling well. °How is this diagnosed? °This condition may be diagnosed with: °· A physical exam. °· Blood tests. °· Urine test. °To confirm the diagnosis, an  ultrasound, MRI, or CT scan may be done. °How is this treated? °This condition is usually treated by taking out the appendix (appendectomy). There are two methods for doing an appendectomy: °· Open appendectomy. In this surgery, the appendix is removed through a large cut (incision) that is made in the lower right abdomen. This procedure may be recommended if: °¨ You have major scarring from a previous surgery. °¨ You have a bleeding disorder. °¨ You are pregnant and are near term. °¨ You have a condition that makes the laparoscopic procedure impossible, such as an advanced infection or a ruptured appendix. °· Laparoscopic appendectomy. In this surgery, the appendix is removed through small incisions. This procedure usually causes less pain and fewer problems than an open appendectomy. It also has a shorter recovery time. °If the appendix has ruptured and an abscess has formed, a drain may be placed into the abscess to remove fluid and antibiotic medicines may be given through an IV tube. The appendix may or may not need to be removed. °This information is not intended to replace advice given to you by your health care provider. Make sure you discuss any questions you have with your health care provider. °Document Released: 02/07/2005 Document Revised: 06/17/2015 Document Reviewed: 06/25/2014 °Elsevier Interactive Patient Education © 2017 [employer] ° °

## 2018-01-06 NOTE — Progress Notes (Addendum)
Patient arrived to the unit with assist of PACU nurse and his wife at bedside. Oriented patient to room as well as [facility] policies.

## 2018-01-06 NOTE — Discharge Summary (Signed)
Physician Discharge Summary    Patient ID: Colin Kim MRN: [id number] DOB/AGE: 06-16-1960  57 y.o.  Admit date: 01/05/2018 Discharge date: 01/06/2018   Hospital Stay = 0 days  Patient Care Team: Patient, No Pcp Per as PCP - General (General Practice)  Discharge Diagnoses:  Principal Problem:   Acute appendicitis s/p lap appendectomy 01/05/2018 Active Problems:   Infarcted colonic appendices epiploicae/cyst s/p excison 01/05/2018   1 Day Post-Op  01/05/2018  POST-OPERATIVE DIAGNOSIS:   Appendicitis  SURGERY:  01/05/2018  Procedure(s): APPENDECTOMY LAPAROSCOPIC Excision of inflamed cystic mass versus epiploic appendage  SURGEON:    Surgeon(s): Kinsinger, Arta Bruce, MD  Consults: None  Hospital Course:   Patient with signs and symptoms suspicious for appendicitis.  Inflamed cystic mass nearby as well.  The patient underwent the surgery above.  Postoperatively, the patient gradually mobilized and advanced to a solid diet.  Pain and other symptoms were treated aggressively.    By the time of discharge, the patient was walking well the hallways, eating food, having flatus.  Pain was well-controlled on an oral medications.  Based on meeting discharge criteria and continuing to recover, I felt it was safe for the patient to be discharged from the hospital to further recover with close followup. Postoperative recommendations were discussed in detail.  They are written as well.  Discharged Condition: good  Disposition:  [facility], [place]. Schedule an appointment as soon as possible for a visit in 3 week(s).   Specialty:  General Surgery Contact information: [address] [place] [phone]          Discharge disposition: 01-Home or Self Care       Discharge Instructions    Call MD for:   Complete by:  As directed    FEVER > 101.5 F  (temperatures < 101.5 F are not  significant)   Call MD for:  extreme fatigue   Complete by:  As directed    Call MD for:  persistant dizziness or light-headedness   Complete by:  As directed    Call MD for:  persistant nausea and vomiting   Complete by:  As directed    Call MD for:  redness, tenderness, or signs of infection (pain, swelling, redness, odor or green/yellow discharge around incision site)   Complete by:  As directed    Call MD for:  severe uncontrolled pain   Complete by:  As directed    Diet - low sodium heart healthy   Complete by:  As directed    Start with a bland diet such as soups, liquids, starchy foods, low fat foods, etc. the first few days at home. Gradually advance to a solid, low-fat, high fiber diet by the end of the first week at home.   Add a fiber supplement to your diet (Metamucil, etc) If you feel full, bloated, or constipated, stay on a full liquid or pureed/blenderized diet for a few days until you feel better and are no longer constipated.   Discharge instructions   Complete by:  As directed    See Discharge Instructions If you are not getting better after two weeks or are noticing you are getting worse, contact our office [phone] for further advice.  We may need to adjust your medications, re-evaluate you in the office, send you to the emergency room, or see what other things we can do to help. The clinic staff is available to answer  your questions during regular business hours (8:30am-5pm).  Please don't hesitate to call and ask to speak to one of our nurses for clinical concerns.    A surgeon from [facility] is always on call at the hospitals 24 hours/day If you have a medical emergency, go to the nearest emergency room or call 911.   Discharge wound care:   Complete by:  As directed    It is good for closed incisions and even open wounds to be washed every day.  Shower every day.  Short baths are fine.  Wash the incisions and wounds clean with soap & water.      You may leave closed incisions open to air if it is dry.   You may cover the incision with clean gauze & replace it after your daily shower for comfort.   You have skin glue (Dermabond) on your incision, leave them in place.  They will fall off on their own like a scab.  You may trim any edges that curl up with clean scissors.   Driving Restrictions   Complete by:  As directed    You may drive when: - you are no longer taking narcotic prescription pain medication - you can comfortably wear a seatbelt - you can safely make sudden turns/stops without pain.   Increase activity slowly   Complete by:  As directed    Start light daily activities --- self-care, walking, climbing stairs- beginning the day after surgery.  Gradually increase activities as tolerated.  Control your pain to be active.  Stop when you are tired.  Ideally, walk several times a day, eventually an hour a day.   Most people are back to most day-to-day activities in a few weeks.  It takes 4-6 weeks to get back to unrestricted, intense activity. If you can walk 30 minutes without difficulty, it is safe to try more intense activity such as jogging, treadmill, bicycling, low-impact aerobics, swimming, etc. Save the most intensive and strenuous activity for last (Usually 4-8 weeks after surgery) such as sit-ups, heavy lifting, contact sports, etc.  Refrain from any intense heavy lifting or straining until you are off narcotics for pain control.  You will have off days, but things should improve week-by-week. DO NOT PUSH THROUGH PAIN.  Let pain be your guide: If it hurts to do something, don't do it.   Lifting restrictions   Complete by:  As directed    If you can walk 30 minutes without difficulty, it is safe to try more intense activity such as jogging, treadmill, bicycling, low-impact aerobics, swimming, etc. Save the most intensive and strenuous activity for last (Usually 4-8 weeks after surgery) such as sit-ups, heavy lifting,  contact sports, etc.   Refrain from any intense heavy lifting or straining until you are off narcotics for pain control.  You will have off days, but things should improve week-by-week. DO NOT PUSH THROUGH PAIN.  Let pain be your guide: If it hurts to do something, don't do it.  Pain is your body warning you to avoid that activity for another week until the pain goes down.   May shower / Bathe   Complete by:  As directed    May walk up steps   Complete by:  As directed    Sexual Activity Restrictions   Complete by:  As directed    You may have sexual intercourse when it is comfortable. If it hurts to do something, stop.  Allergies as of 01/06/2018      Reactions   Statins    Joint pain      Medication List    TAKE these medications   aspirin EC 81 MG tablet Take 81 mg by mouth at bedtime.   traMADol 50 MG tablet Commonly known as:  ULTRAM Take 1 tablet (50 mg total) by mouth every 6 (six) hours as needed (mild pain).            Discharge Care Instructions  (From admission, onward)         Start     Ordered   01/06/18 0000  Discharge wound care:    Comments:  It is good for closed incisions and even open wounds to be washed every day.  Shower every day.  Short baths are fine.  Wash the incisions and wounds clean with soap & water.     You may leave closed incisions open to air if it is dry.   You may cover the incision with clean gauze & replace it after your daily shower for comfort.   You have skin glue (Dermabond) on your incision, leave them in place.  They will fall off on their own like a scab.  You may trim any edges that curl up with clean scissors.   01/06/18 0957          Significant Diagnostic Studies:  Results for orders placed or performed during the hospital encounter of 01/05/18 (from the past 72 hour(s))  Comprehensive metabolic panel     Status: Abnormal   Collection Time: 01/05/18  9:06 AM  Result Value Ref Range   Sodium 136 135 - 145  mmol/L   Potassium 4.3 3.5 - 5.1 mmol/L   Chloride 104 98 - 111 mmol/L   CO2 24 22 - 32 mmol/L   Glucose, Bld 127 (H) 70 - 99 mg/dL   BUN 12 6 - 20 mg/dL   Creatinine, Ser 1.07 0.61 - 1.24 mg/dL   Calcium 9.4 8.9 - 10.3 mg/dL   Total Protein 7.7 6.5 - 8.1 g/dL   Albumin 4.0 3.5 - 5.0 g/dL   AST 20 15 - 41 U/L   ALT 30 0 - 44 U/L   Alkaline Phosphatase 82 38 - 126 U/L   Total Bilirubin 0.9 0.3 - 1.2 mg/dL   GFR calc non Af Amer >60 >60 mL/min   GFR calc Af Amer >60 >60 mL/min    Comment: (NOTE) The eGFR has been calculated using the CKD EPI equation. This calculation has not been validated in all clinical situations. eGFR's persistently <60 mL/min signify possible Chronic Kidney Disease.    Anion gap 8 5 - 15    Comment: Performed at [facility] [address], [place], [place] [address]  Lipase, blood     Status: None   Collection Time: 01/05/18  9:06 AM  Result Value Ref Range   Lipase 35 11 - 51 U/L    Comment: Performed at [facility] [address] [place], [place] [address]  CBC WITH DIFFERENTIAL     Status: None   Collection Time: 01/05/18  9:06 AM  Result Value Ref Range   WBC 7.6 4.0 - 10.5 K/uL   RBC 5.72 4.22 - 5.81 MIL/uL   Hemoglobin 16.4 13.0 - 17.0 g/dL   HCT 50.3 39.0 - 52.0 %   MCV 87.9 80.0 - 100.0 fL   MCH 28.7 26.0 - 34.0 pg   MCHC  32.6 30.0 - 36.0 g/dL   RDW 12.6 11.5 - 15.5 %   Platelets 241 150 - 400 K/uL   nRBC 0.0 0.0 - 0.2 %   Neutrophils Relative % 63 %   Neutro Abs 4.7 1.7 - 7.7 K/uL   Lymphocytes Relative 26 %   Lymphs Abs 2.0 0.7 - 4.0 K/uL   Monocytes Relative 9 %   Monocytes Absolute 0.7 0.1 - 1.0 K/uL   Eosinophils Relative 2 %   Eosinophils Absolute 0.1 0.0 - 0.5 K/uL   Basophils Relative 0 %   Basophils Absolute 0.0 0.0 - 0.1 K/uL   Immature Granulocytes 0 %   Abs Immature Granulocytes 0.02 0.00 - 0.07 K/uL    Comment: Performed at [facility] [address], [place], [place] [address]  Urinalysis,  Routine w reflex microscopic     Status: None   Collection Time: 01/05/18 10:10 AM  Result Value Ref Range   Color, Urine YELLOW YELLOW   APPearance CLEAR CLEAR   Specific Gravity, Urine 1.006 1.005 - 1.030   pH 6.0 5.0 - 8.0   Glucose, UA NEGATIVE NEGATIVE mg/dL   Hgb urine dipstick NEGATIVE NEGATIVE   Bilirubin Urine NEGATIVE NEGATIVE   Ketones, ur NEGATIVE NEGATIVE mg/dL   Protein, ur NEGATIVE NEGATIVE mg/dL   Nitrite NEGATIVE NEGATIVE   Leukocytes, UA NEGATIVE NEGATIVE    Comment: Performed at [facility] [address], [place], [place] [address]  HIV antibody (Routine Testing)     Status: None   Collection Time: 01/05/18  6:51 PM  Result Value Ref Range   HIV Screen 4th Generation wRfx Non Reactive Non Reactive    Comment: (NOTE) Performed At: [facility] [place], [place] [id number] Rush Farmer MD HQ:[id number]     Ct Abdomen Pelvis W Contrast  Result Date: 01/05/2018 CLINICAL DATA:  RIGHT lower quadrant abdominal pain, acute onset last night. Afebrile. EXAM: CT ABDOMEN AND PELVIS WITH CONTRAST TECHNIQUE: Multidetector CT imaging of the abdomen and pelvis was performed using the standard protocol following bolus administration of intravenous contrast. CONTRAST:  176m OMNIPAQUE IOHEXOL 300 MG/ML  SOLN COMPARISON:  None. FINDINGS: Lower chest: No acute abnormality. Hepatobiliary: No focal liver abnormality is seen. No gallstones, gallbladder wall thickening, or biliary dilatation. Pancreas: Unremarkable. No pancreatic ductal dilatation or surrounding inflammatory changes. Spleen: Normal in size without focal abnormality. Adrenals/Urinary Tract: Adrenal glands appear normal. Kidneys are unremarkable without mass, stone or hydronephrosis. No perinephric fluid. No ureteral or bladder calculi. Bladder appears normal. Stomach/Bowel: No dilated large or small bowel loops. There is prominent inflammatory thickening at the base of the cecum, contiguous  with the tip of the appendix (axial series 3, images 70 through 73; sagittal series 7, images 25 through 31; coronal series 6, images 50 through 52; Vascular/Lymphatic: Aortic atherosclerosis. No enlarged abdominal or pelvic lymph nodes. Reproductive: Prostate is unremarkable. Other: Mild fluid stranding within the RIGHT lower quadrant mesentery. No abscess collection seen. No free intraperitoneal air seen. Musculoskeletal: No acute or suspicious osseous finding. Mild degenerative spondylosis within the lower lumbar spine. IMPRESSION: Prominent inflammatory-like thickening at the base of the cecum, contiguous with the tip of the appendix, highly suspicious for acute tip appendicitis. Additional differential considerations include cecal diverticulitis, typhlitis and neoplastic process of the cecum or appendix. No abscess collection seen. No free intraperitoneal air. Aortic Atherosclerosis (ICD10-I70.0). Electronically Signed   By: SFranki CabotM.D.   On: 01/05/2018 12:03    Discharge Exam:  Blood pressure 117/85, pulse 79, temperature 97.9 F (36.6 C), temperature source Oral, resp. rate 16, SpO2 95 %.  General: Pt awake/alert/oriented x4 in No acute distress Eyes: PERRL, normal EOM.  Sclera clear.  No icterus Neuro: CN II-XII intact w/o focal sensory/motor deficits. Lymph: No head/neck/groin lymphadenopathy Psych:  No delerium/psychosis/paranoia HENT: Normocephalic, Mucus membranes moist.  No thrush Neck: Supple, No tracheal deviation Chest: No chest wall pain w good excursion CV:  Pulses intact.  Regular rhythm MS: Normal AROM mjr joints.  No obvious deformity Abdomen: Soft.  Nondistended.  Nontender.  Laparoscopic incision sites clean dry and intact.  No evidence of peritonitis.  No incarcerated hernias. Ext:  SCDs BLE.  No mjr edema.  No cyanosis Skin: No petechiae / purpura  Past Medical History:  Diagnosis Date  . Arthritis   . Hyperlipemia     Past Surgical History:  Procedure  Laterality Date  . BACK SURGERY    . HERNIA REPAIR    . LAPAROSCOPIC APPENDECTOMY N/A 01/05/2018   Procedure: APPENDECTOMY LAPAROSCOPIC;  Surgeon: Kinsinger, Arta Bruce, MD;  Location: [facility];  Service: General;  Laterality: N/A;  . REPLACEMENT TOTAL KNEE    . SHOULDER SURGERY      Social History   Socioeconomic History  . Marital status: Married    Spouse name: Not on file  . Number of children: Not on file  . Years of education: Not on file  . Highest education level: Not on file  Occupational History  . Not on file  Social Needs  . Financial resource strain: Not on file  . Food insecurity:    Worry: Not on file    Inability: Not on file  . Transportation needs:    Medical: Not on file    Non-medical: Not on file  Tobacco Use  . Smoking status: Not on file  Substance and Sexual Activity  . Alcohol use: Not on file  . Drug use: Not on file  . Sexual activity: Not on file  Lifestyle  . Physical activity:    Days per week: Not on file    Minutes per session: Not on file  . Stress: Not on file  Relationships  . Social connections:    Talks on phone: Not on file    Gets together: Not on file    Attends religious service: Not on file    Active member of club or organization: Not on file    Attends meetings of clubs or organizations: Not on file    Relationship status: Not on file  . Intimate partner violence:    Fear of current or ex partner: Not on file    Emotionally abused: Not on file    Physically abused: Not on file    Forced sexual activity: Not on file  Other Topics Concern  . Not on file  Social History Narrative  . Not on file    History reviewed. No pertinent family history.  Current Facility-Administered Medications  Medication Dose Route Frequency Provider Last Rate Last Dose  . 0.9 %  sodium chloride infusion   Intravenous Continuous Meuth, Brooke A, PA-C   Stopped at 01/05/18 1215  . aspirin EC tablet 81 mg  81 mg Oral Ardeen Fillers, MD        . celecoxib (CELEBREX) capsule 200 mg  200 mg Oral BID Kinsinger, Arta Bruce, MD   200 mg at 01/05/18 2257  . dextrose 5 %-0.45 % sodium chloride infusion   Intravenous  Continuous Kinsinger, Arta Bruce, MD 10 mL/hr at 01/05/18 2254    . diphenhydrAMINE (BENADRYL) capsule 25 mg  25 mg Oral Q6H PRN Kinsinger, Arta Bruce, MD       Or  . diphenhydrAMINE (BENADRYL) injection 25 mg  25 mg Intravenous Q6H PRN Kinsinger, Arta Bruce, MD      . gabapentin (NEURONTIN) capsule 300 mg  300 mg Oral BID Kinsinger, Arta Bruce, MD   300 mg at 01/05/18 2257  . hydrALAZINE (APRESOLINE) injection 10 mg  10 mg Intravenous Q2H PRN Kinsinger, Arta Bruce, MD      . HYDROcodone-acetaminophen (NORCO/VICODIN) 5-325 MG per tablet 1-2 tablet  1-2 tablet Oral Q4H PRN Kinsinger, Arta Bruce, MD      . lactated ringers infusion   Intravenous Continuous Lillia Abed, MD      . morphine 2 MG/ML injection 2 mg  2 mg Intravenous Q1H PRN Kinsinger, Arta Bruce, MD      . ondansetron (ZOFRAN-ODT) disintegrating tablet 4 mg  4 mg Oral Q6H PRN Kinsinger, Arta Bruce, MD       Or  . ondansetron [facility]) injection 4 mg  4 mg Intravenous Q6H PRN Kinsinger, Arta Bruce, MD      . traMADol Veatrice Bourbon) tablet 50 mg  50 mg Oral Q6H PRN Kinsinger, Arta Bruce, MD         Allergies  Allergen Reactions  . Statins     Joint pain    Signed: Morton Peters, MD, FACS, MASCRS Gastrointestinal and Minimally Invasive Surgery    [address] [place], [place] [address] [phone] Main / Paging [phone] Fax   01/06/2018, 9:57 AM

## 2019-05-15 IMAGING — CT CT ABD-PELV W/ CM
2 of 5 series · 16 of 46 positions shown, 18 images · IV contrast (APPLIED)
Comparison: None.

CLINICAL DATA: RIGHT lower quadrant abdominal pain, acute onset
last night. Afebrile.

EXAM:
CT ABDOMEN AND PELVIS WITH CONTRAST
TECHNIQUE: Multidetector CT imaging of the abdomen and pelvis was performed
using the standard protocol following bolus administration of
intravenous contrast.
CONTRAST:  100mL OMNIPAQUE IOHEXOL 300 MG/ML  SOLN

[Series 3: abd/ pelvis 5.0 i30f 2 · axial · 0.92mm/px · z∈[+618,+1073]mm · 13 of 103 slices shown, 15 images]
[im 6/103  soft-tissue]
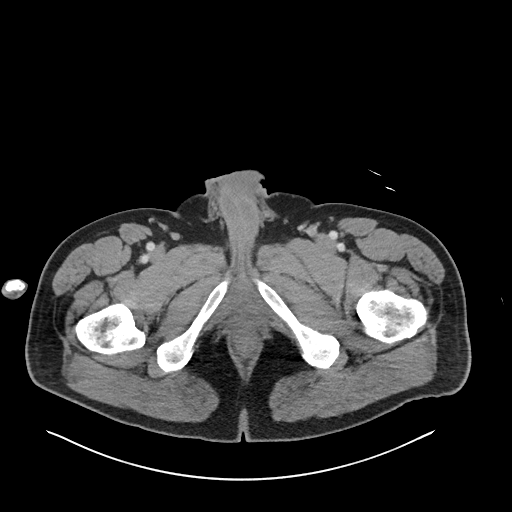
[im 6/103  bone]
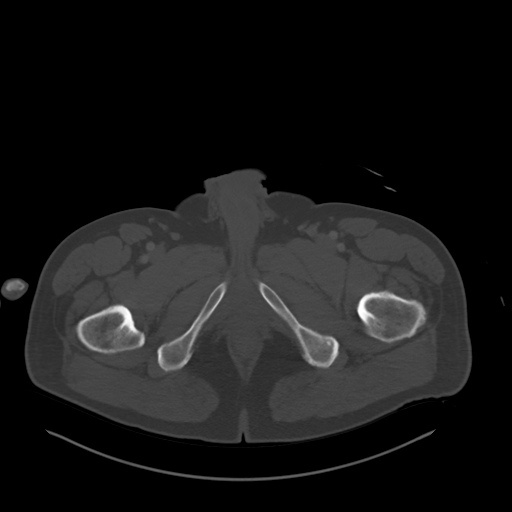
[im 12/103  soft-tissue]
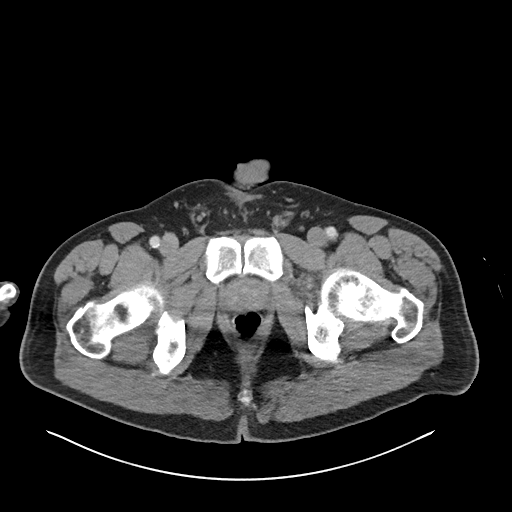
[im 23/103  soft-tissue]
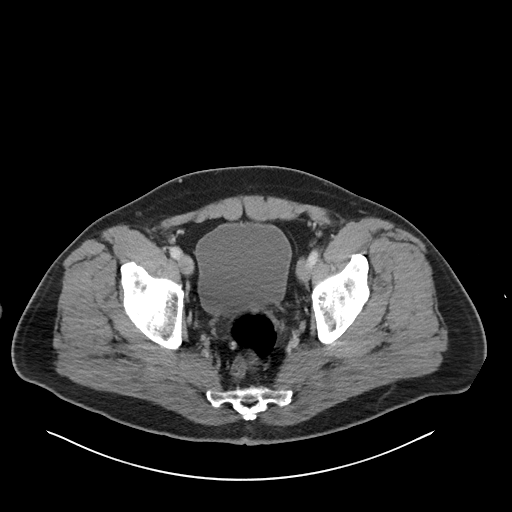
[im 29/103  soft-tissue]
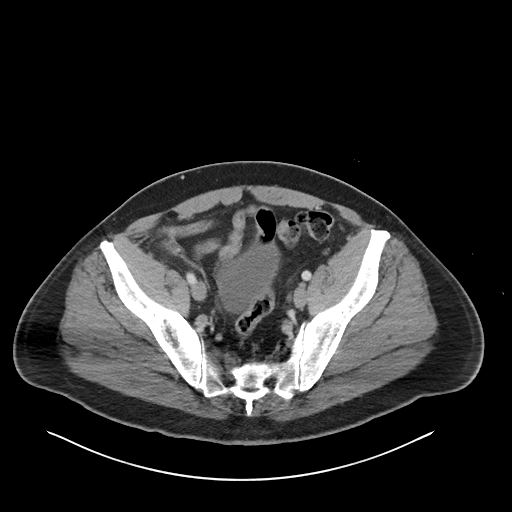
[im 35/103  soft-tissue]
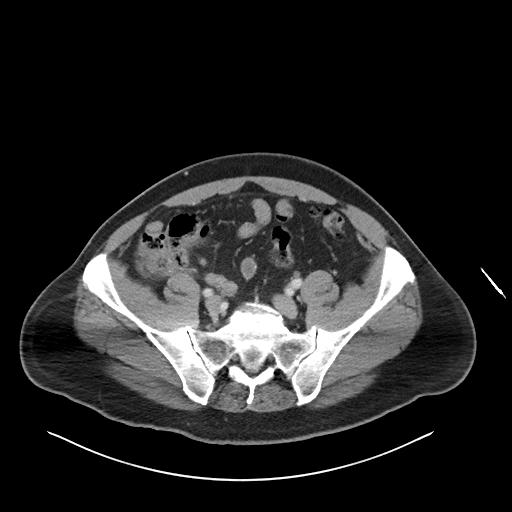
[im 46/103  soft-tissue]
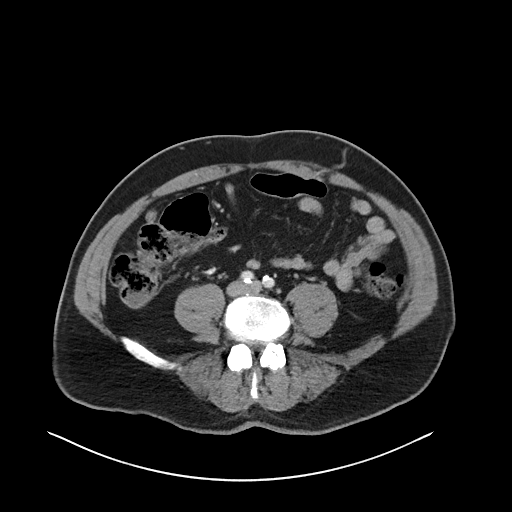
[im 52/103  soft-tissue]
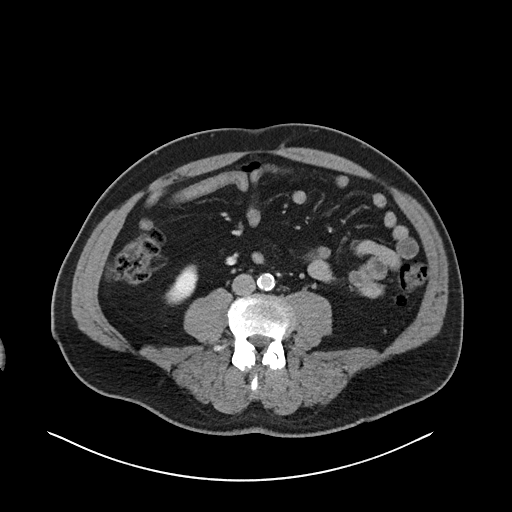
[im 57/103  soft-tissue]
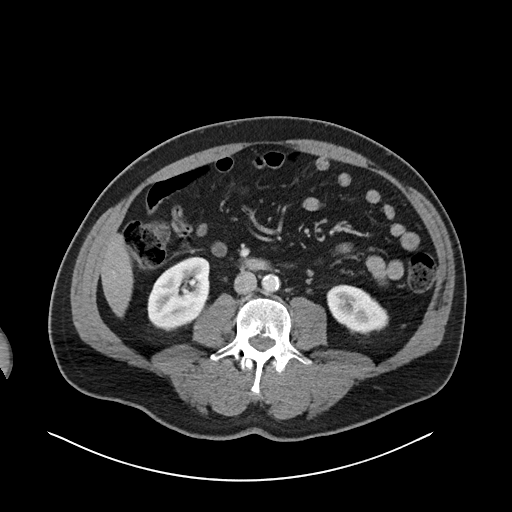
[im 69/103  soft-tissue]
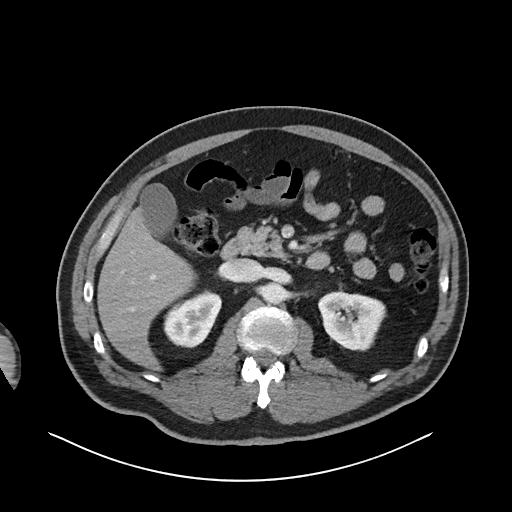
[im 69/103  bone]
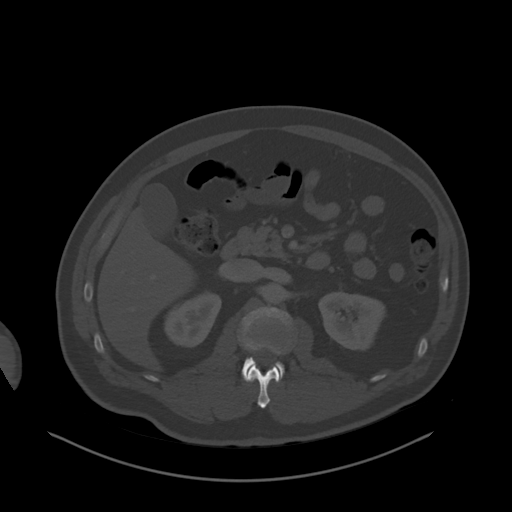
[im 74/103  soft-tissue]
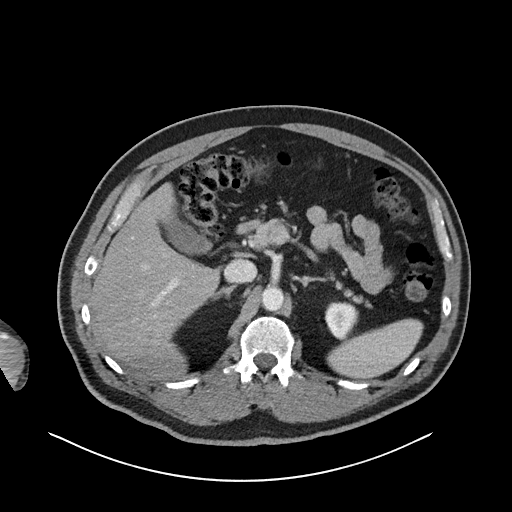
[im 80/103  soft-tissue]
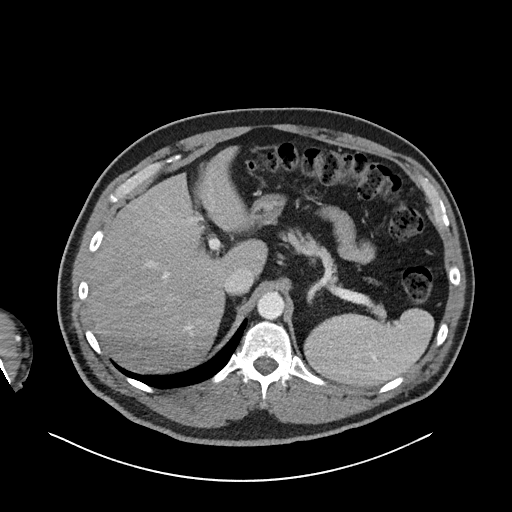
[im 91/103  soft-tissue]
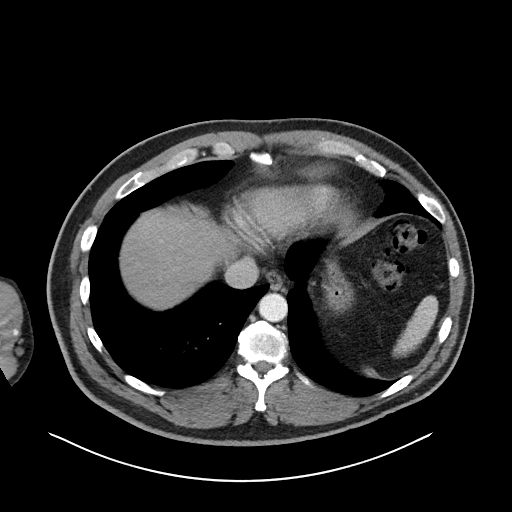
[im 97/103  soft-tissue]
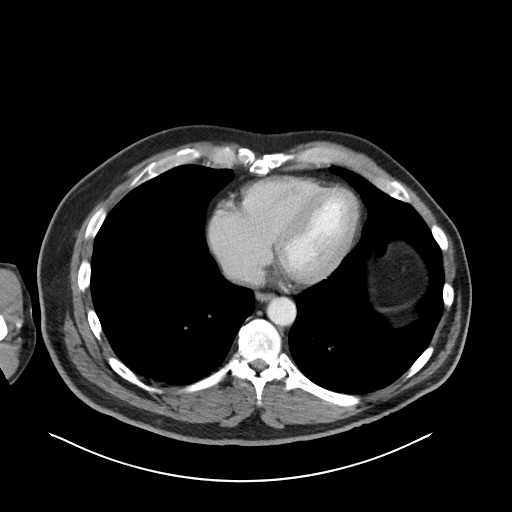

[Series 6: coronal soft tissue · coronal · 0.87mm/px · 3 of 107 slices shown]
[im 36/107  soft-tissue]
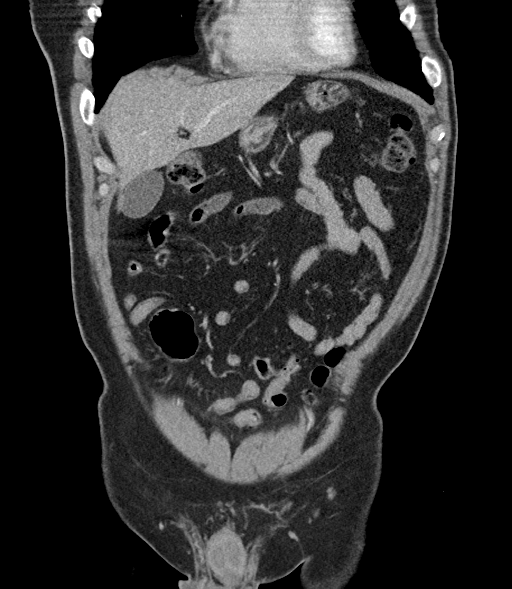
[im 48/107  soft-tissue]
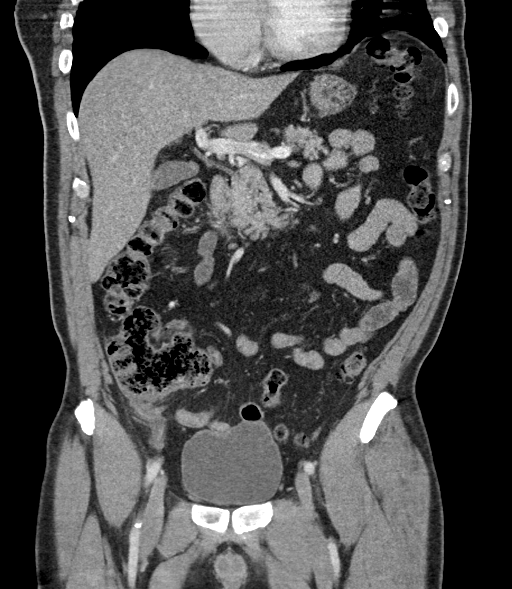
[im 59/107  soft-tissue]
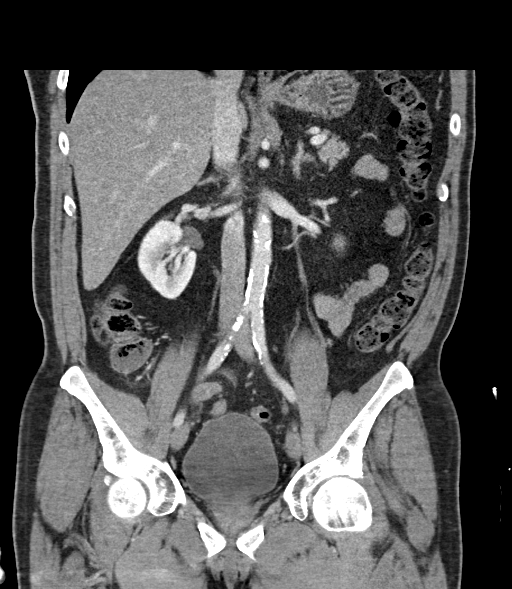

[16 of 46 positions shown; findings below may reference images not displayed]

FINDINGS: Lower chest: No acute abnormality.

Hepatobiliary: No focal liver abnormality is seen. No gallstones,
gallbladder wall thickening, or biliary dilatation.

Pancreas: Unremarkable. No pancreatic ductal dilatation or
surrounding inflammatory changes.

Spleen: Normal in size without focal abnormality.

Adrenals/Urinary Tract: Adrenal glands appear normal. Kidneys are
unremarkable without mass, stone or hydronephrosis. No perinephric
fluid. No ureteral or bladder calculi. Bladder appears normal.

Stomach/Bowel: No dilated large or small bowel loops. There is
prominent inflammatory thickening at the base of the cecum,
contiguous with the tip of the appendix (axial series 3, images 70
6, images 50 through 52;

Vascular/Lymphatic: Aortic atherosclerosis. No enlarged abdominal or
pelvic lymph nodes.

Reproductive: Prostate is unremarkable.

Other: Mild fluid stranding within the RIGHT lower quadrant
mesentery. No abscess collection seen. No free intraperitoneal air
seen.

Musculoskeletal: No acute or suspicious osseous finding. Mild
degenerative spondylosis within the lower lumbar spine.
IMPRESSION: Prominent inflammatory-like thickening at the base of the cecum,
contiguous with the tip of the appendix, highly suspicious for acute
tip appendicitis. Additional differential considerations include
cecal diverticulitis, typhlitis and neoplastic process of the cecum
or appendix. No abscess collection seen. No free intraperitoneal
air.

Aortic Atherosclerosis (QP1C0-K88.8).
